# Patient Record
Sex: Female | Born: 1995 | Race: White | Hispanic: No | Marital: Married | Smoking: Never smoker
Health system: Southern US, Community
[De-identification: ages and names within clinical notes are randomized; demographics above are authoritative.]

---

## 2016-09-05 DIAGNOSIS — Z13220 Encounter for screening for lipoid disorders: Secondary | ICD-10-CM | POA: Diagnosis not present

## 2016-09-05 DIAGNOSIS — Z0000 Encounter for general adult medical examination without abnormal findings: Secondary | ICD-10-CM | POA: Diagnosis not present

## 2017-10-24 DIAGNOSIS — L700 Acne vulgaris: Secondary | ICD-10-CM | POA: Diagnosis not present

## 2017-10-24 DIAGNOSIS — L249 Irritant contact dermatitis, unspecified cause: Secondary | ICD-10-CM | POA: Diagnosis not present

## 2017-10-24 DIAGNOSIS — Z79899 Other long term (current) drug therapy: Secondary | ICD-10-CM | POA: Diagnosis not present

## 2017-11-21 DIAGNOSIS — Z79899 Other long term (current) drug therapy: Secondary | ICD-10-CM | POA: Diagnosis not present

## 2017-11-21 DIAGNOSIS — L700 Acne vulgaris: Secondary | ICD-10-CM | POA: Diagnosis not present

## 2017-12-18 DIAGNOSIS — R0600 Dyspnea, unspecified: Secondary | ICD-10-CM | POA: Diagnosis not present

## 2017-12-21 DIAGNOSIS — L700 Acne vulgaris: Secondary | ICD-10-CM | POA: Diagnosis not present

## 2017-12-21 DIAGNOSIS — Z79899 Other long term (current) drug therapy: Secondary | ICD-10-CM | POA: Diagnosis not present

## 2017-12-23 ENCOUNTER — Emergency Department (HOSPITAL_COMMUNITY)
Admission: EM | Admit: 2017-12-23 | Discharge: 2017-12-24 | Disposition: A | Discharge status: Home / Self Care | Payer: BLUE CROSS/BLUE SHIELD | Attending: Emergency Medicine | Admitting: Emergency Medicine

## 2017-12-23 ENCOUNTER — Emergency Department (HOSPITAL_COMMUNITY): Payer: BLUE CROSS/BLUE SHIELD

## 2017-12-23 ENCOUNTER — Encounter (HOSPITAL_COMMUNITY): Payer: Self-pay | Admitting: Emergency Medicine

## 2017-12-23 DIAGNOSIS — R42 Dizziness and giddiness: Secondary | ICD-10-CM | POA: Insufficient documentation

## 2017-12-23 DIAGNOSIS — R0789 Other chest pain: Secondary | ICD-10-CM | POA: Insufficient documentation

## 2017-12-23 DIAGNOSIS — R079 Chest pain, unspecified: Secondary | ICD-10-CM | POA: Diagnosis not present

## 2017-12-23 DIAGNOSIS — R0602 Shortness of breath: Secondary | ICD-10-CM | POA: Diagnosis not present

## 2017-12-23 DIAGNOSIS — R0600 Dyspnea, unspecified: Secondary | ICD-10-CM | POA: Diagnosis not present

## 2017-12-23 DIAGNOSIS — R51 Headache: Secondary | ICD-10-CM | POA: Diagnosis not present

## 2017-12-23 DIAGNOSIS — R55 Syncope and collapse: Secondary | ICD-10-CM | POA: Insufficient documentation

## 2017-12-23 LAB — CBC WITH DIFFERENTIAL/PLATELET
BASOS ABS: 0 10*3/uL (ref 0.0–0.1)
BASOS PCT: 1 %
EOS PCT: 0 %
Eosinophils Absolute: 0 10*3/uL (ref 0.0–0.7)
HEMATOCRIT: 42.8 % (ref 36.0–46.0)
Hemoglobin: 14.4 g/dL (ref 12.0–15.0)
LYMPHS PCT: 11 %
Lymphs Abs: 0.8 10*3/uL (ref 0.7–4.0)
MCH: 30.7 pg (ref 26.0–34.0)
MCHC: 33.6 g/dL (ref 30.0–36.0)
MCV: 91.3 fL (ref 78.0–100.0)
Monocytes Absolute: 0.8 10*3/uL (ref 0.1–1.0)
Monocytes Relative: 12 %
NEUTROS ABS: 5.1 10*3/uL (ref 1.7–7.7)
Neutrophils Relative %: 76 %
PLATELETS: 316 10*3/uL (ref 150–400)
RBC: 4.69 MIL/uL (ref 3.87–5.11)
RDW: 12.9 % (ref 11.5–15.5)
WBC: 6.8 10*3/uL (ref 4.0–10.5)

## 2017-12-23 LAB — BASIC METABOLIC PANEL
Anion gap: 12 (ref 5–15)
BUN: 13 mg/dL (ref 6–20)
CHLORIDE: 103 mmol/L (ref 101–111)
CO2: 20 mmol/L — ABNORMAL LOW (ref 22–32)
Calcium: 9.6 mg/dL (ref 8.9–10.3)
Creatinine, Ser: 0.79 mg/dL (ref 0.44–1.00)
GFR calc Af Amer: >60 mL/min (ref >60)
GFR calc non Af Amer: >60 mL/min (ref >60)
GLUCOSE: 101 mg/dL — AB (ref 65–99)
POTASSIUM: 3.9 mmol/L (ref 3.5–5.1)
Sodium: 135 mmol/L (ref 135–145)

## 2017-12-23 LAB — D-DIMER, QUANTITATIVE: D-Dimer, Quant: 0.75 ug/mL-FEU — ABNORMAL HIGH (ref 0.00–0.50)

## 2017-12-23 LAB — I-STAT BETA HCG BLOOD, ED (MC, WL, AP ONLY)

## 2017-12-23 LAB — I-STAT CG4 LACTIC ACID, ED: LACTIC ACID, VENOUS: 0.98 mmol/L (ref 0.5–1.9)

## 2017-12-23 LAB — I-STAT TROPONIN, ED: Troponin i, poc: 0 ng/mL (ref 0.00–0.08)

## 2017-12-23 MED ORDER — SODIUM CHLORIDE 0.9 % IV BOLUS (SEPSIS)
1000.0000 mL | Freq: Once | INTRAVENOUS | Status: AC
  Administered 2017-12-23: 1000 mL via INTRAVENOUS

## 2017-12-23 MED ORDER — IOPAMIDOL (ISOVUE-370) INJECTION 76%
INTRAVENOUS | Status: AC
  Administered 2017-12-24: 100 mL
  Filled 2017-12-23: qty 100

## 2017-12-23 NOTE — ED Triage Notes (Signed)
Patient reports SOB with mild chest tightness onset this afternoon , denies cough , no fever or chills . HR= 136/min at triage .

## 2017-12-23 NOTE — ED Notes (Addendum)
Patient had a syncopal episode while at triage room .

## 2017-12-23 NOTE — ED Provider Notes (Signed)
[facility] EMERGENCY DEPARTMENT Provider Note   CSN: [id number] Arrival date & time: 12/23/17  2227     History   Chief Complaint Chief Complaint  Patient presents with  . Shortness of Breath    Chest Tightness    HPI Susan Knox is a 22 y.o. female.  Patient presents with central chest tightness and shortness of breath at onset around 4 PM.  She states this happened while she was eating chocolate from [employer].  She felt well prior to this.  She immediately developed a headache after eating the chocolate followed by chest tightness and shortness of breath.  Denies any cough or fever.  Chest tightness is central and does not radiate.  No abdominal pain, nausea or vomiting.  States good p.o. intake and urine output today.  No runny nose, sore throat, fever, body aches.  Denies any chronic medical conditions or chronic medications.  While getting blood drawn in triage, she had a syncopal episode which was preceded by dizziness and lightheadedness.  No injury. Denies possibility of pregnancy.   The history is provided by a relative and the patient.  Shortness of Breath  Associated symptoms include headaches. Pertinent negatives include no fever, no rhinorrhea, no cough, no chest pain, no vomiting, no abdominal pain and no rash.    History reviewed. No pertinent past medical history.  There are no active problems to display for this patient.   History reviewed. No pertinent surgical history.  OB History    No data available       Home Medications    Prior to Admission medications   Not on File    Family History No family history on file.  Social History Social History   Tobacco Use  . Smoking status: Never Smoker  . Smokeless tobacco: Never Used  Substance Use Topics  . Alcohol use: No    Frequency: Never  . Drug use: No     Allergies   Patient has no known allergies.   Review of Systems Review of Systems  Constitutional:  Positive for fatigue. Negative for activity change, appetite change and fever.  HENT: Negative for congestion and rhinorrhea.   Respiratory: Positive for chest tightness and shortness of breath. Negative for cough.   Cardiovascular: Negative for chest pain.  Gastrointestinal: Negative for abdominal pain and vomiting.  Genitourinary: Negative for dysuria, hematuria, vaginal bleeding and vaginal discharge.  Musculoskeletal: Negative for arthralgias, back pain and myalgias.  Skin: Negative for rash.  Neurological: Positive for dizziness, syncope, light-headedness and headaches. Negative for weakness.    all other systems are negative except as noted in the HPI and PMH.    Physical Exam Updated Vital Signs BP (!) 66/50 (BP Location: Left Arm)   Pulse (!) 52   Temp 100 F (37.8 C) (Oral)   Resp 16   LMP 11/21/2017 (Approximate)   SpO2 99%   Physical Exam  Constitutional: She is oriented to person, place, and time. She appears well-developed and well-nourished. No distress.  HENT:  Head: Normocephalic and atraumatic.  Mouth/Throat: Oropharynx is clear and moist. No oropharyngeal exudate.  Moist mucous membranes, no tongue or lip swelling, uvula midline  Eyes: Conjunctivae and EOM are normal. Pupils are equal, round, and reactive to light.  Neck: Normal range of motion. Neck supple.  No meningismus.  Cardiovascular: Normal rate, normal heart sounds and intact distal pulses.  No murmur heard. Tachycardic 110s  Pulmonary/Chest: Effort normal and breath sounds normal. No respiratory  distress. She exhibits no tenderness.  Abdominal: Soft. There is no tenderness. There is no rebound and no guarding.  Musculoskeletal: Normal range of motion. She exhibits no edema or tenderness.  Neurological: She is alert and oriented to person, place, and time. No cranial nerve deficit. She exhibits normal muscle tone. Coordination normal.  No ataxia on finger to nose bilaterally. No pronator drift. 5/5  strength throughout. CN 2-12 intact.Equal grip strength. Sensation intact.   Skin: Skin is warm. Capillary refill takes less than 2 seconds. No erythema.  Psychiatric: She has a normal mood and affect. Her behavior is normal.  Nursing note and vitals reviewed.    ED Treatments / Results  Labs (all labs ordered are listed, but only abnormal results are displayed) Labs Reviewed  BASIC METABOLIC PANEL - Abnormal; Notable for the following components:      Result Value   CO2 20 (*)    Glucose, Bld 101 (*)    All other components within normal limits  D-DIMER, QUANTITATIVE (NOT AT [facility]) - Abnormal; Notable for the following components:   D-Dimer, Quant 0.75 (*)    All other components within normal limits  URINALYSIS, ROUTINE W REFLEX MICROSCOPIC - Abnormal; Notable for the following components:   Color, Urine STRAW (*)    Ketones, ur 5 (*)    All other components within normal limits  CBC WITH DIFFERENTIAL/PLATELET  RAPID URINE DRUG SCREEN, HOSP PERFORMED  TSH  I-STAT TROPONIN, ED  I-STAT BETA HCG BLOOD, ED ([facility], [facility], [facility] ONLY)  I-STAT CG4 LACTIC ACID, ED  CBG MONITORING, ED  I-STAT CG4 LACTIC ACID, ED  I-STAT TROPONIN, ED    EKG  EKG Interpretation  Date/Time:  Saturday December 23 2017 22:40:35 EST Ventricular Rate:  137 PR Interval:  116 QRS Duration: 76 QT Interval:  292 QTC Calculation: 440 R Axis:   105 Text Interpretation:  Sinus tachycardia Rightward axis Anterior infarct , age undetermined Abnormal ECG No previous ECGs available Confirmed by Glynn Octaveancour, Siddhant Hashemi [phone]([id number]) on 12/23/2017 10:55:35 PM       Radiology Dg Chest 2 View  Result Date: 12/23/2017 CLINICAL DATA:  Chest pain.  Shortness of breath. EXAM: CHEST  2 VIEW COMPARISON:  None. FINDINGS: The heart size and mediastinal contours are within normal limits. Both lungs are clear. The visualized skeletal structures are unremarkable. IMPRESSION: No active cardiopulmonary disease. Electronically Signed   By: Gerome Samavid   Williams III M.D   On: 12/23/2017 23:52   Ct Angio Chest Pe W And/or Wo Contrast  Result Date: 12/24/2017 CLINICAL DATA:  22 year old female with shortness of breath. EXAM: CT ANGIOGRAPHY CHEST WITH CONTRAST TECHNIQUE: Multidetector CT imaging of the chest was performed using the standard protocol during bolus administration of intravenous contrast. Multiplanar CT image reconstructions and MIPs were obtained to evaluate the vascular anatomy. CONTRAST:  100mL ISOVUE-370 IOPAMIDOL (ISOVUE-370) INJECTION 76% COMPARISON:  Chest radiograph dated 12/23/2017 FINDINGS: Cardiovascular: There is no cardiomegaly or pericardial effusion. The thoracic aorta is unremarkable. The origins of the great vessels of the aortic arch are patent. There is no CT evidence of pulmonary embolism. Mediastinum/Nodes: Top-normal right hilar lymph node measures 9 mm. No mediastinal adenopathy. Mildly prominent superior aortic pericardial recess in the anterior mediastinum. Lungs/Pleura: Mildly diffuse hazy airspace density with areas of heterogeneity at the lung bases most consistent with atelectatic changes. Mild edema or an atypical infection is less likely. Clinical correlation is recommended. There is no focal consolidation, pleural effusion, or pneumothorax. The central airways are patent. Upper Abdomen:  No acute abnormality. Musculoskeletal: No chest wall abnormality. No acute or significant osseous findings. Review of the MIP images confirms the above findings. IMPRESSION: 1. No CT evidence of pulmonary embolism. 2. Mild diffuse hazy airspace density, likely atelectatic changes. Edema or atypical infection is less likely. Clinical correlation is recommended. Electronically Signed   By: Elgie Collard M.D.   On: 12/24/2017 00:27    Procedures Procedures (including critical care time)  Medications Ordered in ED Medications  sodium chloride 0.9 % bolus 1,000 mL (1,000 mLs Intravenous New Bag/Given 12/23/17 2308)  sodium chloride  0.9 % bolus 1,000 mL (1,000 mLs Intravenous New Bag/Given 12/23/17 2309)     Initial Impression / Assessment and Plan / ED Course  I have reviewed the triage vital signs and the nursing notes.  Pertinent labs & imaging results that were available during my care of the patient were reviewed by me and considered in my medical decision making (see chart for details).    Patient with chest tightness, shortness of breath onset after eating chocolate this afternoon.  She feels improved now after syncopal episode in triage.  She is tachycardic with moist mucous membranes.  No chest pain or shortness of breath currently  Nonfocal neuro exam.  Patient given IV fluids.  EKG with sinus tachycardia.  HCG is negative  Troponin is negative.  Patient given IV fluids.  D-dimer is positive.  She denies any chest pain or shortness of breath currently.  There is no evidence of PE. No CP or SOB.  No throat tightness. No tongue or lip swelling. No rash.  Unclear whether this is allergic reaction. No rash or wheezing. No swelling or itching.  Heart rate has improved to 98.  Patient is asymptomatic.  She is tolerating p.o. and ambulatory.  No chest pain or shortness of breath.  Believe anxiety is playing a role in her tachycardia. TSH wnl.  Low suspicion for ACS or PE. Followup with PCP regarding tachycardia. Avoid this brand of chocolate in future. Return precautions discussed.  BP (!) 116/59   Pulse (!) 107   Temp 98.6 F (37 C) (Oral)   Resp 19   LMP 11/21/2017 (Approximate)   SpO2 96%   Final Clinical Impressions(s) / ED Diagnoses   Final diagnoses:  Atypical chest pain  Dyspnea, unspecified type    ED Discharge Orders    None       Britanni Yarde, Jeannett Senior, MD 12/24/17 0900

## 2017-12-24 ENCOUNTER — Emergency Department (HOSPITAL_COMMUNITY): Payer: BLUE CROSS/BLUE SHIELD

## 2017-12-24 DIAGNOSIS — R0602 Shortness of breath: Secondary | ICD-10-CM | POA: Diagnosis not present

## 2017-12-24 LAB — URINALYSIS, ROUTINE W REFLEX MICROSCOPIC
BILIRUBIN URINE: NEGATIVE
GLUCOSE, UA: NEGATIVE mg/dL
Hgb urine dipstick: NEGATIVE
KETONES UR: 5 mg/dL — AB
Leukocytes, UA: NEGATIVE
NITRITE: NEGATIVE
PH: 7 (ref 5.0–8.0)
Protein, ur: NEGATIVE mg/dL
Specific Gravity, Urine: 1.016 (ref 1.005–1.030)

## 2017-12-24 LAB — RAPID URINE DRUG SCREEN, HOSP PERFORMED
Amphetamines: NOT DETECTED
BARBITURATES: NOT DETECTED
Benzodiazepines: NOT DETECTED
Cocaine: NOT DETECTED
Opiates: NOT DETECTED
Tetrahydrocannabinol: NOT DETECTED

## 2017-12-24 LAB — CBG MONITORING, ED: Glucose-Capillary: 85 mg/dL (ref 65–99)

## 2017-12-24 LAB — I-STAT CG4 LACTIC ACID, ED: LACTIC ACID, VENOUS: 0.8 mmol/L (ref 0.5–1.9)

## 2017-12-24 LAB — TSH: TSH: 1.507 u[IU]/mL (ref 0.350–4.500)

## 2017-12-24 LAB — I-STAT TROPONIN, ED: Troponin i, poc: 0.01 ng/mL (ref 0.00–0.08)

## 2017-12-24 MED ORDER — SODIUM CHLORIDE 0.9 % IV BOLUS (SEPSIS)
1000.0000 mL | Freq: Once | INTRAVENOUS | Status: AC
  Administered 2017-12-24: 1000 mL via INTRAVENOUS

## 2017-12-24 NOTE — Discharge Instructions (Signed)
There is no evidence of heart attack or blood clot in the lung.  It is unclear whether you had an allergic reaction or adverse reaction to the chocolate that you ate. Use benadryl as needed for rash or itching. Keep yourself hydrated.  And follow-up with your doctor.  Return to the ED if you develop chest pain, shortness of breath, or any new or worsening symptoms.

## 2017-12-24 NOTE — ED Notes (Signed)
Pt to br 

## 2017-12-25 DIAGNOSIS — E7800 Pure hypercholesterolemia, unspecified: Secondary | ICD-10-CM | POA: Diagnosis not present

## 2017-12-25 DIAGNOSIS — R000 Tachycardia, unspecified: Secondary | ICD-10-CM | POA: Diagnosis not present

## 2017-12-25 DIAGNOSIS — R0602 Shortness of breath: Secondary | ICD-10-CM | POA: Diagnosis not present

## 2017-12-26 DIAGNOSIS — Z79899 Other long term (current) drug therapy: Secondary | ICD-10-CM | POA: Diagnosis not present

## 2017-12-26 DIAGNOSIS — L700 Acne vulgaris: Secondary | ICD-10-CM | POA: Diagnosis not present

## 2018-01-17 DIAGNOSIS — R0600 Dyspnea, unspecified: Secondary | ICD-10-CM | POA: Diagnosis not present

## 2018-01-29 DIAGNOSIS — Z79899 Other long term (current) drug therapy: Secondary | ICD-10-CM | POA: Diagnosis not present

## 2018-01-29 DIAGNOSIS — L700 Acne vulgaris: Secondary | ICD-10-CM | POA: Diagnosis not present

## 2018-01-30 DIAGNOSIS — Z79899 Other long term (current) drug therapy: Secondary | ICD-10-CM | POA: Diagnosis not present

## 2018-01-30 DIAGNOSIS — L700 Acne vulgaris: Secondary | ICD-10-CM | POA: Diagnosis not present

## 2018-02-15 DIAGNOSIS — R05 Cough: Secondary | ICD-10-CM | POA: Diagnosis not present

## 2018-02-15 DIAGNOSIS — R0600 Dyspnea, unspecified: Secondary | ICD-10-CM | POA: Diagnosis not present

## 2018-03-02 DIAGNOSIS — L900 Lichen sclerosus et atrophicus: Secondary | ICD-10-CM | POA: Diagnosis not present

## 2018-03-02 DIAGNOSIS — Z79899 Other long term (current) drug therapy: Secondary | ICD-10-CM | POA: Diagnosis not present

## 2018-03-05 DIAGNOSIS — Z79899 Other long term (current) drug therapy: Secondary | ICD-10-CM | POA: Diagnosis not present

## 2018-03-05 DIAGNOSIS — L700 Acne vulgaris: Secondary | ICD-10-CM | POA: Diagnosis not present

## 2018-04-02 DIAGNOSIS — L700 Acne vulgaris: Secondary | ICD-10-CM | POA: Diagnosis not present

## 2018-04-02 DIAGNOSIS — Z79899 Other long term (current) drug therapy: Secondary | ICD-10-CM | POA: Diagnosis not present

## 2018-04-03 DIAGNOSIS — L700 Acne vulgaris: Secondary | ICD-10-CM | POA: Diagnosis not present

## 2018-04-03 DIAGNOSIS — Z79899 Other long term (current) drug therapy: Secondary | ICD-10-CM | POA: Diagnosis not present

## 2018-05-07 DIAGNOSIS — L700 Acne vulgaris: Secondary | ICD-10-CM | POA: Diagnosis not present

## 2018-05-07 DIAGNOSIS — Z79899 Other long term (current) drug therapy: Secondary | ICD-10-CM | POA: Diagnosis not present

## 2018-05-17 DIAGNOSIS — R0600 Dyspnea, unspecified: Secondary | ICD-10-CM | POA: Diagnosis not present

## 2018-05-23 DIAGNOSIS — R918 Other nonspecific abnormal finding of lung field: Secondary | ICD-10-CM | POA: Diagnosis not present

## 2018-06-04 DIAGNOSIS — L700 Acne vulgaris: Secondary | ICD-10-CM | POA: Diagnosis not present

## 2018-06-04 DIAGNOSIS — Z79899 Other long term (current) drug therapy: Secondary | ICD-10-CM | POA: Diagnosis not present

## 2018-06-05 DIAGNOSIS — L700 Acne vulgaris: Secondary | ICD-10-CM | POA: Diagnosis not present

## 2018-06-05 DIAGNOSIS — Z79899 Other long term (current) drug therapy: Secondary | ICD-10-CM | POA: Diagnosis not present

## 2018-06-22 DIAGNOSIS — R0600 Dyspnea, unspecified: Secondary | ICD-10-CM | POA: Diagnosis not present

## 2018-06-22 DIAGNOSIS — R0602 Shortness of breath: Secondary | ICD-10-CM | POA: Diagnosis not present

## 2018-07-06 DIAGNOSIS — L700 Acne vulgaris: Secondary | ICD-10-CM | POA: Diagnosis not present

## 2018-07-06 DIAGNOSIS — Z79899 Other long term (current) drug therapy: Secondary | ICD-10-CM | POA: Diagnosis not present

## 2018-07-09 DIAGNOSIS — L700 Acne vulgaris: Secondary | ICD-10-CM | POA: Diagnosis not present

## 2018-08-07 DIAGNOSIS — L700 Acne vulgaris: Secondary | ICD-10-CM | POA: Diagnosis not present

## 2018-08-07 DIAGNOSIS — Z79899 Other long term (current) drug therapy: Secondary | ICD-10-CM | POA: Diagnosis not present

## 2018-08-14 DIAGNOSIS — J351 Hypertrophy of tonsils: Secondary | ICD-10-CM | POA: Diagnosis not present

## 2018-08-14 DIAGNOSIS — R000 Tachycardia, unspecified: Secondary | ICD-10-CM | POA: Diagnosis not present

## 2018-09-03 DIAGNOSIS — J351 Hypertrophy of tonsils: Secondary | ICD-10-CM | POA: Diagnosis not present

## 2018-09-03 DIAGNOSIS — Q181 Preauricular sinus and cyst: Secondary | ICD-10-CM | POA: Diagnosis not present

## 2018-09-03 DIAGNOSIS — J342 Deviated nasal septum: Secondary | ICD-10-CM | POA: Diagnosis not present

## 2018-09-11 DIAGNOSIS — L708 Other acne: Secondary | ICD-10-CM | POA: Diagnosis not present

## 2018-10-02 NOTE — Progress Notes (Signed)
Cardiology Office Note:    Date:  10/03/2018   ID:  Susan Knox, DOB 07-07-1996, MRN [id number]  PCP:  Deatra James, MD  Cardiologist:  No primary care provider on file.   Referring MD: Deatra James, MD   Chief Complaint  Patient presents with  . Advice Only    Hyperlipidemia  . Tachycardia    History of Present Illness:    Susan Knox is a 22 y.o. female with a hx of palpitations referred for consultation by Dr.V. Sun due to tachycardia  Susan Knox is a very pleasant 22 year old female who began noticing increased heart rates on her wearable iPhone over the past year.  She is referred because there are times where without physical activity, she develops heart rates as high as 170 bpm.  She is usually able to document the rapid heart rates with her iPhone.  She brings a log of her daily heart rate ranges over the past month and frequently has heart rates greater than 150 bpm, occurring without exercise or heavy physical effort.  She can feel her heart racing.  Episodes tend to be abrupt in onset and offset.  She denies chest pain, syncope, dyspnea, and feels that she has no exertional limitations.  She is not one who exercises on a regular basis but does not feel that she is physically deconditioned.  She is aware of extremely elevated lipids.  Reviewing medical records within the [facility] system, she did have an emergency room visit in January 2019 because of atypical chest pain and dyspnea.  A CT scan at that time revealed groundglass interstitial pattern likely suggesting infection.  A repeat CT scan performed at [facility] revealed improvement 6 months later.  History reviewed. No pertinent past medical history.  History reviewed. No pertinent surgical history.  Current Medications: No outpatient medications have been marked as taking for the 10/03/18 encounter (Office Visit) with Lyn Records, MD.     Allergies:   Patient has no known allergies.   Social History    Socioeconomic History  . Marital status: Single    Spouse name: Not on file  . Number of children: Not on file  . Years of education: Not on file  . Highest education level: Not on file  Occupational History  . Not on file  Social Needs  . Financial resource strain: Not on file  . Food insecurity:    Worry: Not on file    Inability: Not on file  . Transportation needs:    Medical: Not on file    Non-medical: Not on file  Tobacco Use  . Smoking status: Never Smoker  . Smokeless tobacco: Never Used  Substance and Sexual Activity  . Alcohol use: No    Frequency: Never  . Drug use: No  . Sexual activity: Not on file  Lifestyle  . Physical activity:    Days per week: Not on file    Minutes per session: Not on file  . Stress: Not on file  Relationships  . Social connections:    Talks on phone: Not on file    Gets together: Not on file    Attends religious service: Not on file    Active member of club or organization: Not on file    Attends meetings of clubs or organizations: Not on file    Relationship status: Not on file  Other Topics Concern  . Not on file  Social History Narrative  . Not on file  Family History: The patient's family history includes Atrial fibrillation in her father; Healthy in her brother and mother.  ROS:   Please see the history of present illness.    Other than palpitations, she has no complaints.  All other systems reviewed and are negative.  EKGs/Labs/Other Studies Reviewed:    The following studies were reviewed today:   High-resolution chest CT June 2019  Exam date/time: 05/23/2018 12:34 PM   FINDINGS:   SUPPORT APPARATUS: N.A.   LUNGS/PLEURA: Improved groundglass opacities compared to prior exam from 12/24/2017. There are a few residual patchy areas of groundglass opacity in the superior segment of the right lower lobe and very subtle areas in the medial right lower lobe. Increased atelectasis  in the lower lobes with  aspiration. No pneumothorax.  No abnormal pulmonary masses.  No pleural effusions.  HEART/MEDIASTINUM: Cardiac size is normal. No acute thoracic aortic abnormalities.  No hilar, mediastinal, or axillary lymphadenopathy.  MUSCULOSKELETAL: No acute or destructive osseous processes.  CHRONIC/INCIDENTAL FINDINGS: N.A.  Other Result Information  Acute Interface, Incoming Rad Results - 05/25/2018  8:21 AM EDT COMPARISON: Outside CT 12/24/2017 INDICATION: abnormal lung sounds, prior ground glass infiltrates TECHNIQUE:  CT CHEST HIGH RESOLUTION -  Contrast: No administration of oral or intravenous contrast.   Radiation dose reduction was utilized (automated exposure control, mA or kV adjustment based on patient size, or iterative image reconstruction).  Exam date/time: 05/23/2018 12:34 PM   FINDINGS:   SUPPORT APPARATUS: N.A.   LUNGS/PLEURA: Improved groundglass opacities compared to prior exam from 12/24/2017. There are a few residual patchy areas of groundglass opacity in the superior segment of the right lower lobe and very subtle areas in the medial right lower lobe. Increased atelectasis  in the lower lobes with aspiration. No pneumothorax.  No abnormal pulmonary masses.  No pleural effusions.  HEART/MEDIASTINUM: Cardiac size is normal. No acute thoracic aortic abnormalities.  No hilar, mediastinal, or axillary lymphadenopathy.  MUSCULOSKELETAL: No acute or destructive osseous processes.  CHRONIC/INCIDENTAL FINDINGS: N.A.   IMPRESSION: Significantly improved groundglass opacities compared to CT from 12/24/2017. There are a few residual groundglass opacities, exacerbated on expiration. Findings suggest atelectasis/air trapping.     Chest CT [facility] done at [facility] 01/01/2018: IMPRESSION: 1. No CT evidence of pulmonary embolism. 2. Mild diffuse hazy airspace density, likely atelectatic changes. Edema or atypical infection is less likely. Clinical correlation  is recommended.  LABORATORY DATA 2019:  LDL cholesterol 273 milligrams per deciliter in 2017  Total cholesterol 280 05 January 2018  TSH 1.507 January 2019  EKG:  EKG is  ordered today.  The ekg ordered today demonstrates normal sinus rhythm, right axis deviation, and poor R wave progression.  When compared to a tracing from January 2019 no significant change has occurred other than heart rate is now normal compared to 137 bpm at that time.  Recent Labs: 12/23/2017: BUN 13; Creatinine, Ser 0.79; Hemoglobin 14.4; Platelets 316; Potassium 3.9; Sodium 135 12/24/2017: TSH 1.507  Recent Lipid Panel No results found for: CHOL, TRIG, HDL, CHOLHDL, VLDL, LDLCALC, LDLDIRECT  Physical Exam:    VS:  BP 114/82   Pulse 92   Ht 5\' 2"  (1.575 m)   Wt 149 lb 6.4 oz (67.8 kg)   BMI 27.33 kg/m     Wt Readings from Last 3 Encounters:  10/03/18 149 lb 6.4 oz (67.8 kg)     GEN:  Well nourished, well developed in no acute distress.  She is short. HEENT: Normal NECK: No  JVD.  Thick neck but I would not describe her as having a web neck...  LYMPHATICS: No lymphadenopathy CARDIAC: RRR, no murmur, no gallop, no edema. VASCULAR: 2+ carotid, radial, and 1+ popliteal bilateral pulses.  No bruits. RESPIRATORY:  Clear to auscultation without rales, wheezing or rhonchi  ABDOMEN: Soft, non-tender, non-distended, No pulsatile mass, MUSCULOSKELETAL: No deformity  SKIN: Warm and dry NEUROLOGIC:  Alert and oriented x 3 PSYCHIATRIC:  Normal affect   ASSESSMENT:    1. Atrial tachycardia (HCC)   2. Right axis deviation   3. Familial hypercholesterolemia   4. Heart palpitations    PLAN:    In order of problems listed above:  1. 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  Her heart rate ranges into the 150-170 range without physical activity, occurring suddenly and resolving quickly.  30-day event monitor is ordered. 2. Given her body habitus, there is a  possibility of congenital heart disease, possibly ASD.  With right axis noted on EKG, we should consider performing an echocardiogram. 3. Lipids are severely elevated with LDL greater than 180.  The most recent LDL was 273 mg/dL in 6045.  Total cholesterol at that time was noted to be 280 in February 2019.  Given her age, she is most likely having a reentrant tachycardia.  We need to identify by continuous monitoring.  Right axis deviation on EKG raises the question of underlying congenital heart disease such as ASD.  No clinical evidence of RVH.  No symptoms.  But will likely need to have an echocardiogram to fully assess.  The most significant problem in my opinion, is the severe elevation in lipids.  I have reviewed her CT scans and there is no evidence of coronary plaque.  She will likely need to be on a program to reduce LDL cholesterol.  Likely refer her to a lipid neurologist, Dr. [place] Hilty within our practice or other depending upon her preference.  For the time being we will simply monitor and determine if she is having arrhythmia with further work-up depending upon findings of monitor.   Medication Adjustments/Labs and Tests Ordered: Current medicines are reviewed at length with the patient today.  Concerns regarding medicines are outlined above.  Orders Placed This Encounter  Procedures  . CARDIAC EVENT MONITOR  . EKG 12-Lead   No orders of the defined types were placed in this encounter.   Patient Instructions  Medication Instructions:  Your physician recommends that you continue on your current medications as directed. Please refer to the Current Medication list given to you today.  If you need a refill on your cardiac medications before your next appointment, please call your pharmacy.   Lab work: none If you have labs (blood work) drawn today and your tests are completely normal, you will receive your results only by: Marland Kitchen MyChart Message (if you have MyChart) OR . A paper  copy in the mail If you have any lab test that is abnormal or we need to change your treatment, we will call you to review the results.  Testing/Procedures: Your physician has recommended that you wear an event monitor. Event monitors are medical devices that record the heart's electrical activity. Doctors most often [place] these monitors to diagnose arrhythmias. Arrhythmias are problems with the speed or rhythm of the heartbeat. The monitor is a small, portable device. You can wear one while you do your normal daily activities. This is usually used to diagnose what is causing  palpitations/syncope (passing out).    Follow-Up: Your physician recommends that you schedule a follow-up appointment in: 6-8 weeks. Scheduled for January 3,2020 at 1:40       Signed, Lesleigh Noe, MD  10/03/2018 10:52 AM    [facility]

## 2018-10-03 ENCOUNTER — Encounter: Payer: Self-pay | Admitting: Interventional Cardiology

## 2018-10-03 ENCOUNTER — Ambulatory Visit (INDEPENDENT_AMBULATORY_CARE_PROVIDER_SITE_OTHER): Payer: BLUE CROSS/BLUE SHIELD | Admitting: Interventional Cardiology

## 2018-10-03 VITALS — BP 114/82 | HR 92 | Ht 62.0 in | Wt 149.4 lb

## 2018-10-03 DIAGNOSIS — E7801 Familial hypercholesterolemia: Secondary | ICD-10-CM | POA: Diagnosis not present

## 2018-10-03 DIAGNOSIS — R002 Palpitations: Secondary | ICD-10-CM | POA: Diagnosis not present

## 2018-10-03 DIAGNOSIS — R9431 Abnormal electrocardiogram [ECG] [EKG]: Secondary | ICD-10-CM | POA: Diagnosis not present

## 2018-10-03 DIAGNOSIS — I471 Supraventricular tachycardia: Secondary | ICD-10-CM | POA: Diagnosis not present

## 2018-10-03 NOTE — Patient Instructions (Addendum)
Medication Instructions:  Your physician recommends that you continue on your current medications as directed. Please refer to the Current Medication list given to you today.  If you need a refill on your cardiac medications before your next appointment, please call your pharmacy.   Lab work: none If you have labs (blood work) drawn today and your tests are completely normal, you will receive your results only by: Marland Kitchen MyChart Message (if you have MyChart) OR . A paper copy in the mail If you have any lab test that is abnormal or we need to change your treatment, we will call you to review the results.  Testing/Procedures: Your physician has recommended that you wear an event monitor. Event monitors are medical devices that record the heart's electrical activity. Doctors most often [place] these monitors to diagnose arrhythmias. Arrhythmias are problems with the speed or rhythm of the heartbeat. The monitor is a small, portable device. You can wear one while you do your normal daily activities. This is usually used to diagnose what is causing palpitations/syncope (passing out).    Follow-Up: Your physician recommends that you schedule a follow-up appointment in: 6-8 weeks. Scheduled for January 3,2020 at 1:40

## 2018-10-11 ENCOUNTER — Ambulatory Visit (INDEPENDENT_AMBULATORY_CARE_PROVIDER_SITE_OTHER): Payer: BLUE CROSS/BLUE SHIELD

## 2018-10-11 DIAGNOSIS — R002 Palpitations: Secondary | ICD-10-CM | POA: Diagnosis not present

## 2018-10-15 DIAGNOSIS — L700 Acne vulgaris: Secondary | ICD-10-CM | POA: Diagnosis not present

## 2018-11-26 ENCOUNTER — Encounter: Payer: Self-pay | Admitting: *Deleted

## 2018-12-03 ENCOUNTER — Telehealth: Payer: Self-pay | Admitting: Interventional Cardiology

## 2018-12-03 MED ORDER — METOPROLOL SUCCINATE ER 25 MG PO TB24: 12.5000 mg | ORAL_TABLET | Freq: Every day | ORAL | 3 refills | Status: DC

## 2018-12-03 NOTE — Telephone Encounter (Signed)
New Message ° ° ° ° ° ° ° ° ° °Patient returned your call °

## 2018-12-03 NOTE — Telephone Encounter (Signed)
Spoke with pt and went over monitor results.  Pt verbalized understanding and was in agreement with this plan.

## 2018-12-06 NOTE — Progress Notes (Signed)
Cardiology Office Note:    Date:  12/07/2018   ID:  Susan Knox, DOB August 15, 1996, MRN [id number]  PCP:  Deatra James, MD  Cardiologist:  Lesleigh Noe, MD   Referring MD: Deatra James, MD   Chief Complaint  Patient presents with  . Follow-up    PSVT    History of Present Illness:    Susan Knox is a 23 y.o. female with a hx of a hx of palpitations referred for consultation by Dr.V. Sun due to tachycardia.  30-day monitor brief episodes of heart rate greater than 190 bpm consistent with paroxysmal atrial tachycardia.  Toprol-XL was recommended at 12.5 mg/day.  The patient balked at the recommendation and does not want to be on medication at her young age.  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  She really does not feel she is having that much difficulty at the current time and will prefer a more conservative approach.  She is physically active.  She is having no limitations in activity.  She exercises including an aerobics boxing class.  We reviewed the rhythm strips.  She has frequent rhythm strips with sinus tachycardia above 160 bpm.  History reviewed. No pertinent past medical history.  History reviewed. No pertinent surgical history.  Current Medications: No outpatient medications have been marked as taking for the 12/07/18 encounter (Office Visit) with Lyn Records, MD.     Allergies:   Patient has no known allergies.   Social History   Socioeconomic History  . Marital status: Single    Spouse name: Not on file  . Number of children: Not on file  . Years of education: Not on file  . Highest education level: Not on file  Occupational History  . Not on file  Social Needs  . Financial resource strain: Not on file  . Food insecurity:    Worry: Not on file    Inability: Not on file  . Transportation needs:    Medical: Not on file    Non-medical: Not on file  Tobacco Use  . Smoking status: Never Smoker  . Smokeless tobacco: Never Used    Substance and Sexual Activity  . Alcohol use: No    Frequency: Never  . Drug use: No  . Sexual activity: Not on file  Lifestyle  . Physical activity:    Days per week: Not on file    Minutes per session: Not on file  . Stress: Not on file  Relationships  . Social connections:    Talks on phone: Not on file    Gets together: Not on file    Attends religious service: Not on file    Active member of club or organization: Not on file    Attends meetings of clubs or organizations: Not on file    Relationship status: Not on file  Other Topics Concern  . Not on file  Social History Narrative  . Not on file     Family History: The patient's family history includes Atrial fibrillation in her father; Healthy in her brother and mother.  ROS:   Please see the history of present illness.    None All other systems reviewed and are negative.  EKGs/Labs/Other Studies Reviewed:    The following studies were reviewed today: October 11, 2018 continuous monitor: Study Highlights     Normal sinus rhythm and sinus tachycardia  Symptom of heart racing associated with sinus tachycardia  Sinus tachycardia led to several auto trigger  recordings. Fastest heart rate 197 bpm.  PACs and PVCs accounting for less than 1% of all cardiac activity respectively.  Heart rates in excess of 180 bpm may represent paroxysmal atrial tachycardia (reentrant atrial tachycardia)   High HR: 197 bpm at 10/31/2018 18:17 EST Low HR: 51 bpm Avg HR:  91 bpm     EKG:  EKG is not repeated today but the October 03, 2018 tracing revealed a PR interval of 125 ms, rightward axis, and otherwise normal in appearance.  Recent Labs: 12/23/2017: BUN 13; Creatinine, Ser 0.79; Hemoglobin 14.4; Platelets 316; Potassium 3.9; Sodium 135 12/24/2017: TSH 1.507  Recent Lipid Panel No results found for: CHOL, TRIG, HDL, CHOLHDL, VLDL, LDLCALC, LDLDIRECT  Physical Exam:    VS:  BP 120/76   Pulse (!) 102   Ht 5\' 2"  (1.575  m)   Wt 150 lb (68 kg)   SpO2 95%   BMI 27.44 kg/m     Wt Readings from Last 3 Encounters:  12/07/18 150 lb (68 kg)  10/03/18 149 lb 6.4 oz (67.8 kg)     GEN: Young and healthy-appearing. No acute distress HEENT: Normal NECK: No JVD. LYMPHATICS: No lymphadenopathy CARDIAC: RRR.  No murmur, gallop, edema VASCULAR: Pulses 2+ bilateral radial and carotid, Bruits are absent and carotids RESPIRATORY:  Clear to auscultation without rales, wheezing or rhonchi  ABDOMEN: Soft, non-tender, non-distended, No pulsatile mass, MUSCULOSKELETAL: No deformity  SKIN: Warm and dry NEUROLOGIC:  Alert and oriented x 3 PSYCHIATRIC:  Normal affect   ASSESSMENT:    1. PSVT (paroxysmal supraventricular tachycardia) (HCC)   2. Familial hypercholesterolemia    PLAN:    In order of problems listed above:  1. 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  After speaking with the patient she is having very minimal if any symptoms currently and therefore we have chosen against medical therapy.  Because of the slight right axis on baseline EKG, an echocardiogram is ordered to exclude the possibility of ASD or other silent congenital abnormality on exam. 2. History of familial hypercholesterolemia.  She has a cholesterol of 280 with an LDL greater than 200 and a triglyceride of 178.  This will need attention in the near future, probably with statin therapy.  PRN follow-up.  Monitoring for symptoms at this time   Medication Adjustments/Labs and Tests Ordered: Current medicines are reviewed at length with the patient today.  Concerns regarding medicines are outlined above.  Orders Placed This Encounter  Procedures  . ECHOCARDIOGRAM COMPLETE   No orders of the defined types were placed in this encounter.   Patient Instructions  Medication Instructions:  Your physician recommends that you continue on your current medications as directed. Please refer to the Current Medication list given to you  today.  If you need a refill on your cardiac medications before your next appointment, please call your pharmacy.   Lab work: None If you have labs (blood work) drawn today and your tests are completely normal, you will receive your results only by: Marland Kitchen. MyChart Message (if you have MyChart) OR . A paper copy in the mail If you have any lab test that is abnormal or we need to change your treatment, we will call you to review the results.  Testing/Procedures: Your physician has requested that you have an echocardiogram. Echocardiography is a painless test that uses sound waves to create images of your heart. It provides your doctor with information about the size and shape of your heart and how well your heart's chambers  and valves are working. This procedure takes approximately one hour. There are no restrictions for this procedure.   Follow-Up: Your physician recommends that you schedule a follow-up appointment as needed with Dr. Katrinka Blazing  Any Other Special Instructions Will Be Listed Below (If Applicable).       Signed, Lesleigh Noe, MD  12/07/2018 2:54 PM    [facility]

## 2018-12-07 ENCOUNTER — Ambulatory Visit (INDEPENDENT_AMBULATORY_CARE_PROVIDER_SITE_OTHER): Payer: BLUE CROSS/BLUE SHIELD | Admitting: Interventional Cardiology

## 2018-12-07 ENCOUNTER — Encounter: Payer: Self-pay | Admitting: Interventional Cardiology

## 2018-12-07 VITALS — BP 120/76 | HR 102 | Ht 62.0 in | Wt 150.0 lb

## 2018-12-07 DIAGNOSIS — I471 Supraventricular tachycardia, unspecified: Secondary | ICD-10-CM | POA: Insufficient documentation

## 2018-12-07 DIAGNOSIS — E7801 Familial hypercholesterolemia: Secondary | ICD-10-CM

## 2018-12-07 NOTE — Patient Instructions (Signed)
Medication Instructions:  Your physician recommends that you continue on your current medications as directed. Please refer to the Current Medication list given to you today.  If you need a refill on your cardiac medications before your next appointment, please call your pharmacy.   Lab work: None If you have labs (blood work) drawn today and your tests are completely normal, you will receive your results only by: . MyChart Message (if you have MyChart) OR . A paper copy in the mail If you have any lab test that is abnormal or we need to change your treatment, we will call you to review the results.  Testing/Procedures: Your physician has requested that you have an echocardiogram. Echocardiography is a painless test that uses sound waves to create images of your heart. It provides your doctor with information about the size and shape of your heart and how well your heart's chambers and valves are working. This procedure takes approximately one hour. There are no restrictions for this procedure.    Follow-Up: Your physician recommends that you schedule a follow-up appointment as needed with Dr. Smith.    Any Other Special Instructions Will Be Listed Below (If Applicable).    

## 2018-12-10 ENCOUNTER — Other Ambulatory Visit (HOSPITAL_COMMUNITY): Payer: BLUE CROSS/BLUE SHIELD

## 2018-12-27 ENCOUNTER — Other Ambulatory Visit (HOSPITAL_COMMUNITY): Payer: BLUE CROSS/BLUE SHIELD

## 2019-01-08 ENCOUNTER — Ambulatory Visit (HOSPITAL_COMMUNITY): Payer: BLUE CROSS/BLUE SHIELD | Attending: Cardiovascular Disease

## 2019-01-08 DIAGNOSIS — I471 Supraventricular tachycardia: Secondary | ICD-10-CM | POA: Diagnosis not present

## 2019-01-18 ENCOUNTER — Encounter: Payer: Self-pay | Admitting: *Deleted

## 2019-01-24 ENCOUNTER — Telehealth: Payer: Self-pay | Admitting: Interventional Cardiology

## 2019-01-24 NOTE — Telephone Encounter (Signed)
Follow up:    Patient calling concerning her results. Patient states that some one called her. Please call patient.

## 2019-01-24 NOTE — Telephone Encounter (Signed)
Informed pt of results. Pt verbalized understanding. 

## 2019-07-17 IMAGING — CT CT ANGIO CHEST
2 of 8 series · 19 of 46 positions shown · IV contrast (OMNI)
Comparison: Chest radiograph dated 12/23/2017

CLINICAL DATA: 21-year-old female with shortness of breath.

EXAM:
CT ANGIOGRAPHY CHEST WITH CONTRAST
TECHNIQUE: Multidetector CT imaging of the chest was performed using the
standard protocol during bolus administration of intravenous
contrast. Multiplanar CT image reconstructions and MIPs were
obtained to evaluate the vascular anatomy.
CONTRAST:  100mL AIGJ2J-FGJ IOPAMIDOL (AIGJ2J-FGJ) INJECTION 76%

[Series 6: thins · axial · 0.77mm/px · z∈[+870,+1122]mm · 16 of 279 slices shown]
[im 13/279  lung]
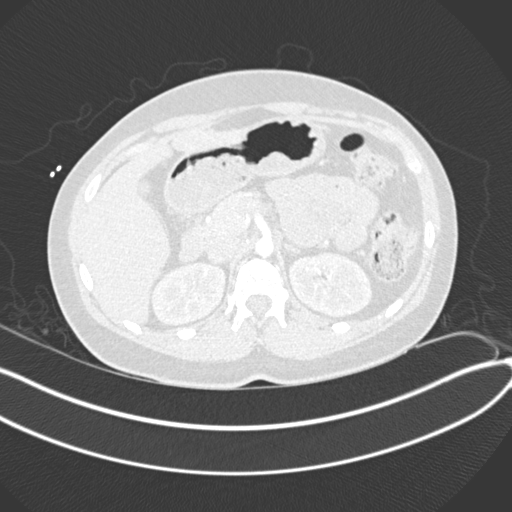
[im 26/279  soft-tissue]
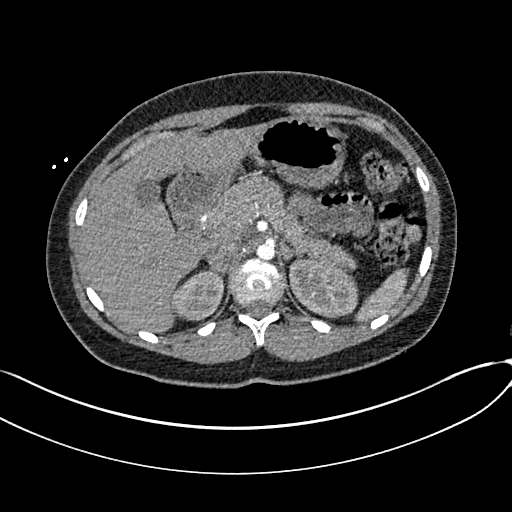
[im 51/279  lung]
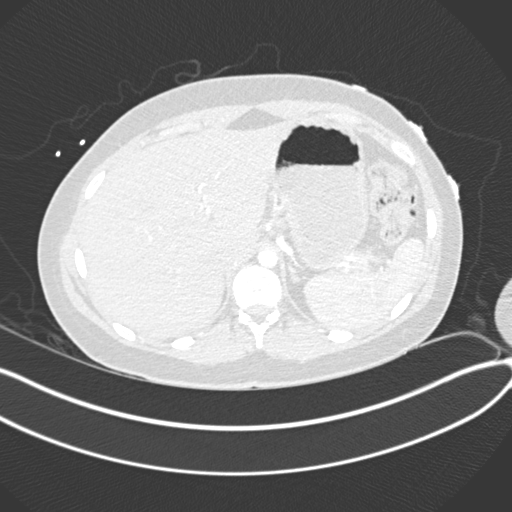
[im 64/279  soft-tissue]
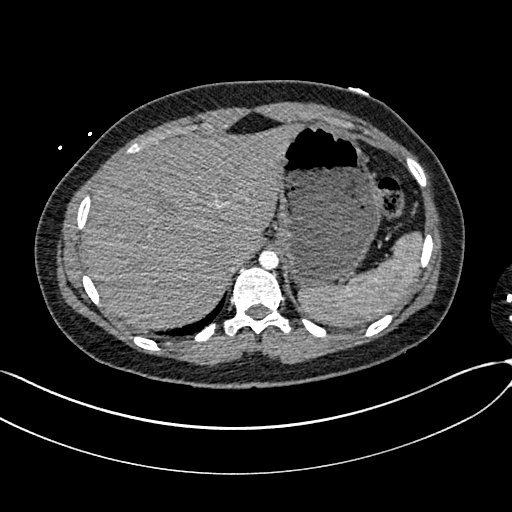
[im 76/279  lung]
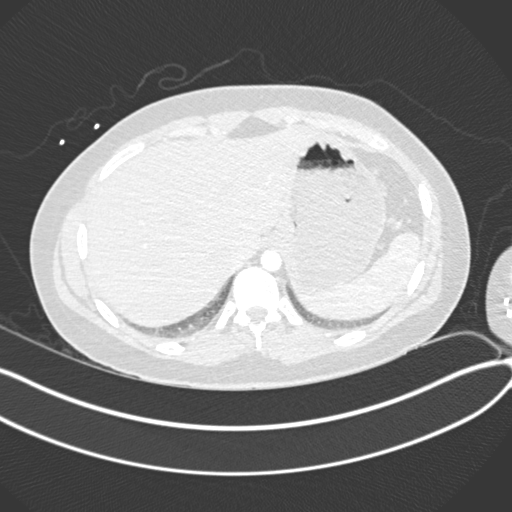
[im 102/279  soft-tissue]
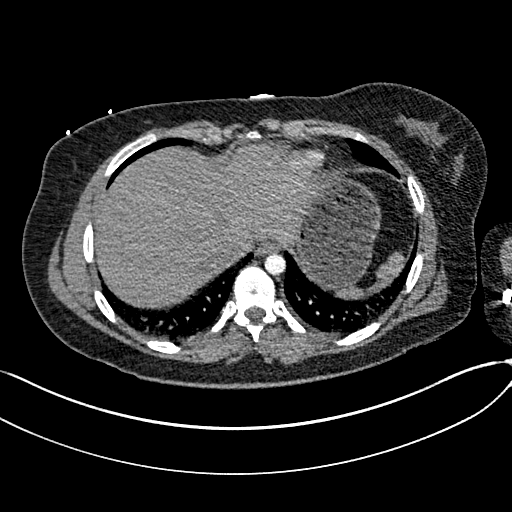
[im 114/279  lung]
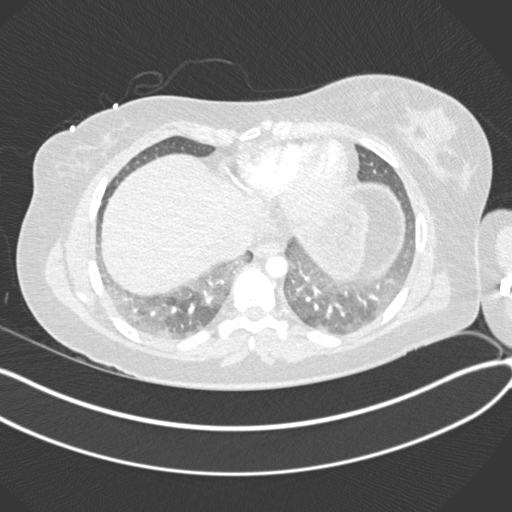
[im 127/279  soft-tissue]
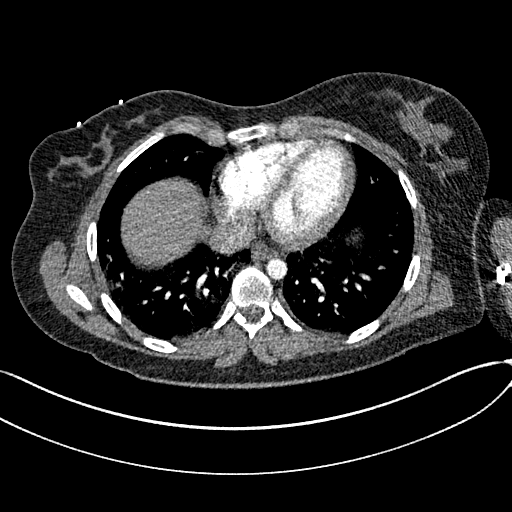
[im 152/279  lung]
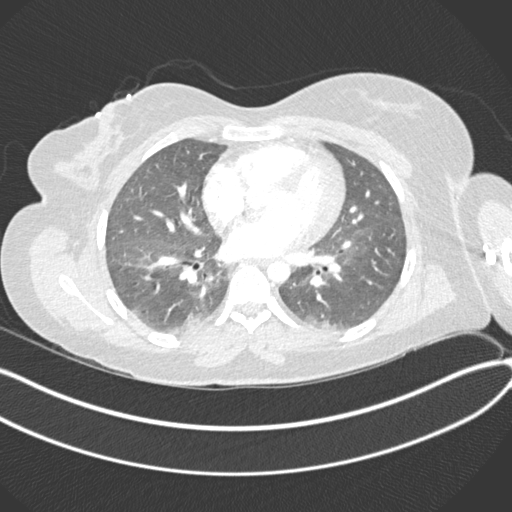
[im 165/279  soft-tissue]
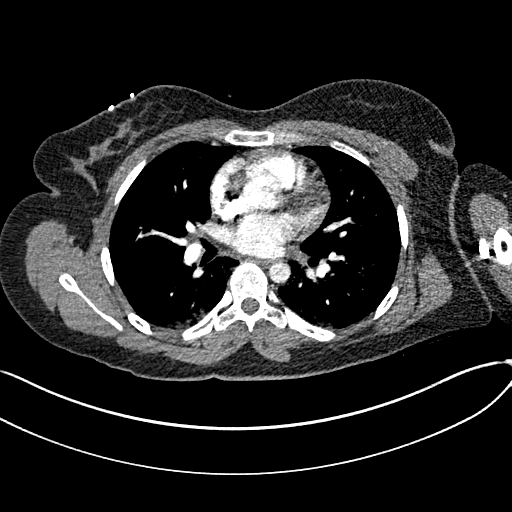
[im 177/279  lung]
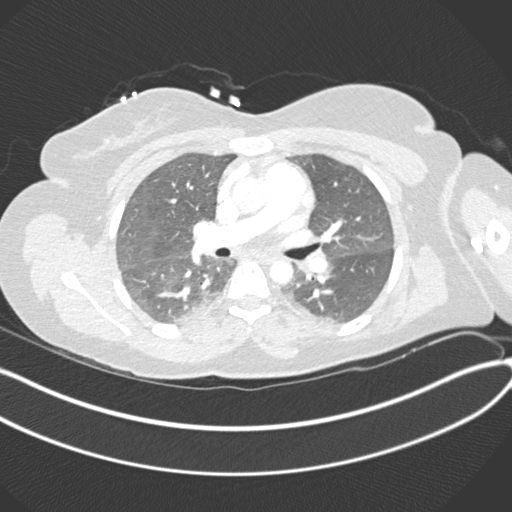
[im 203/279  soft-tissue]
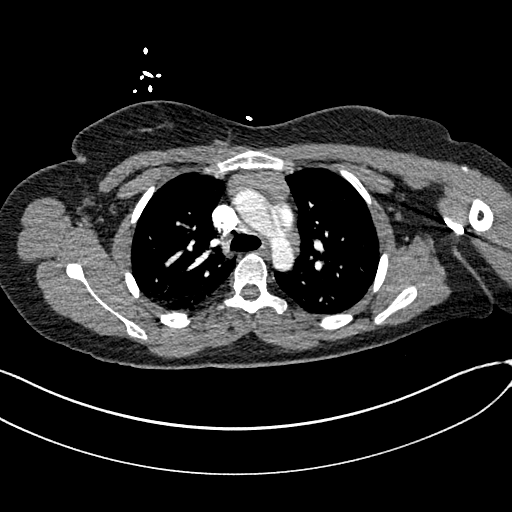
[im 215/279  lung]
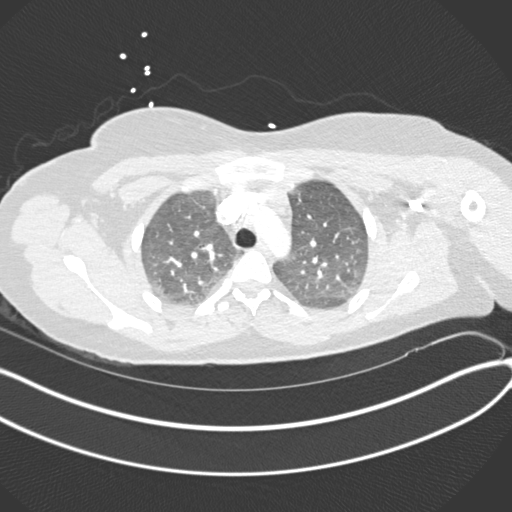
[im 228/279  soft-tissue]
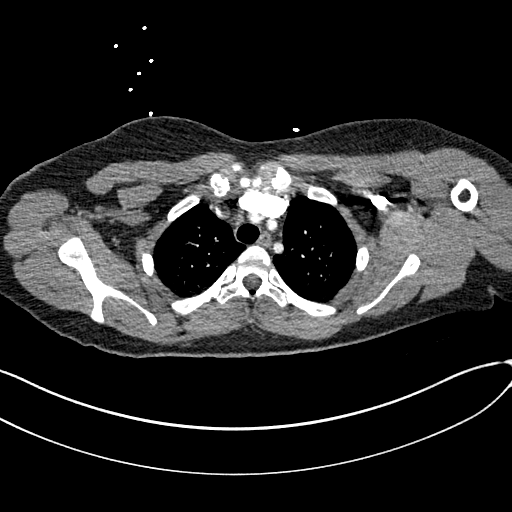
[im 253/279  lung]
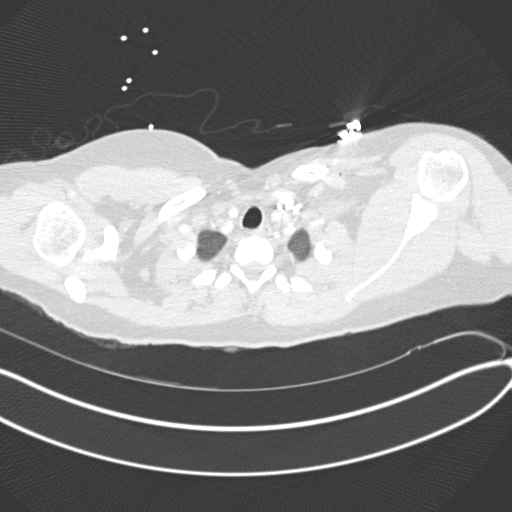
[im 266/279  soft-tissue]
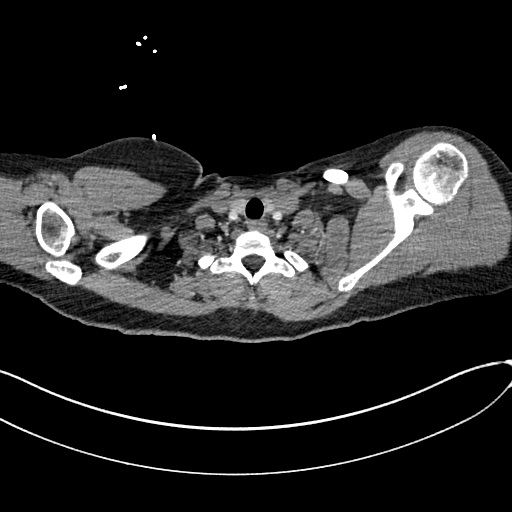

[Series 8: coronal mpr · coronal · 0.59mm/px · 3 of 151 slices shown]
[im 38/151  soft-tissue]
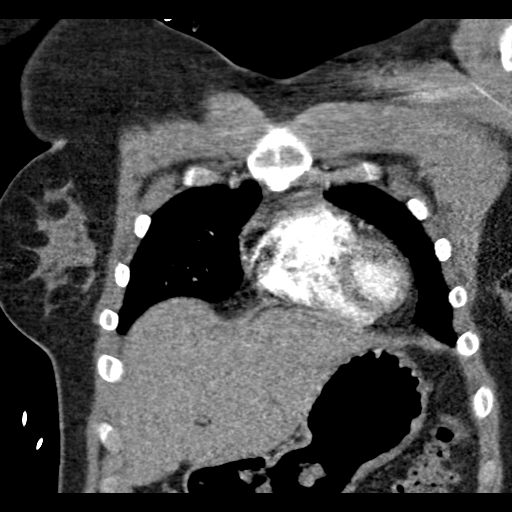
[im 76/151  soft-tissue]
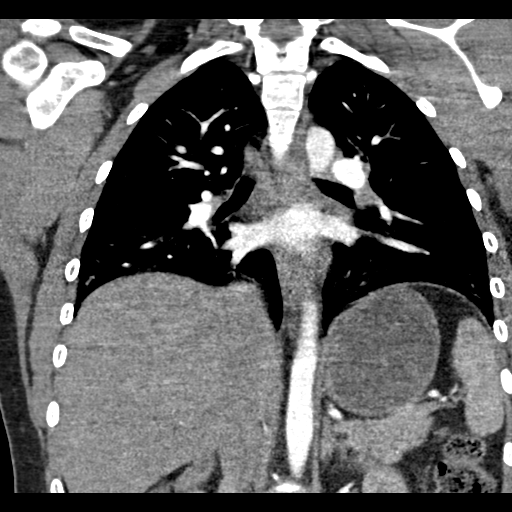
[im 113/151  soft-tissue]
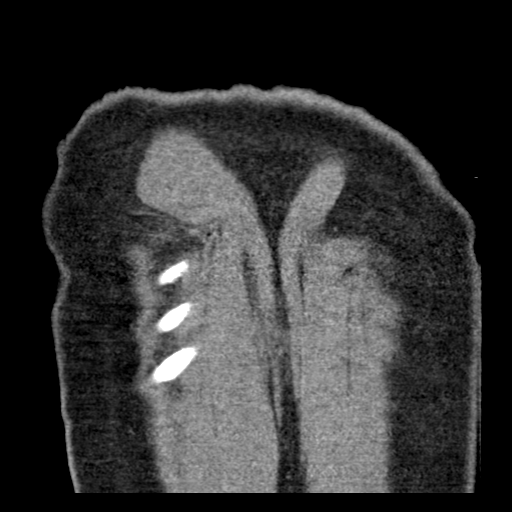

[19 of 46 positions shown; findings below may reference images not displayed]

FINDINGS: Cardiovascular: There is no cardiomegaly or pericardial effusion.
The thoracic aorta is unremarkable. The origins of the great vessels
of the aortic arch are patent. There is no CT evidence of pulmonary
embolism.

Mediastinum/Nodes: Top-normal right hilar lymph node measures 9 mm.
No mediastinal adenopathy. Mildly prominent superior aortic
pericardial recess in the anterior mediastinum.

Lungs/Pleura: Mildly diffuse hazy airspace density with areas of
heterogeneity at the lung bases most consistent with atelectatic
changes. Mild edema or an atypical infection is less likely.
Clinical correlation is recommended. There is no focal
consolidation, pleural effusion, or pneumothorax. The central
airways are patent.

Upper Abdomen: No acute abnormality.

Musculoskeletal: No chest wall abnormality. No acute or significant
osseous findings.

Review of the MIP images confirms the above findings.
IMPRESSION: 1. No CT evidence of pulmonary embolism.
2. Mild diffuse hazy airspace density, likely atelectatic changes.
Edema or atypical infection is less likely. Clinical correlation is
recommended.

## 2019-09-04 DIAGNOSIS — D225 Melanocytic nevi of trunk: Secondary | ICD-10-CM | POA: Diagnosis not present

## 2019-09-04 DIAGNOSIS — L905 Scar conditions and fibrosis of skin: Secondary | ICD-10-CM | POA: Diagnosis not present

## 2019-09-04 DIAGNOSIS — L728 Other follicular cysts of the skin and subcutaneous tissue: Secondary | ICD-10-CM | POA: Diagnosis not present

## 2019-09-04 DIAGNOSIS — L700 Acne vulgaris: Secondary | ICD-10-CM | POA: Diagnosis not present

## 2021-08-15 ENCOUNTER — Encounter (HOSPITAL_COMMUNITY): Payer: Self-pay | Admitting: Emergency Medicine

## 2021-08-15 ENCOUNTER — Emergency Department (HOSPITAL_COMMUNITY): Payer: BC Managed Care – PPO

## 2021-08-15 ENCOUNTER — Emergency Department (HOSPITAL_COMMUNITY)
Admission: EM | Admit: 2021-08-15 | Discharge: 2021-08-16 | Disposition: A | Discharge status: Home / Self Care | Payer: BC Managed Care – PPO | Attending: Emergency Medicine | Admitting: Emergency Medicine

## 2021-08-15 ENCOUNTER — Other Ambulatory Visit: Payer: Self-pay

## 2021-08-15 DIAGNOSIS — Z5321 Procedure and treatment not carried out due to patient leaving prior to being seen by health care provider: Secondary | ICD-10-CM | POA: Diagnosis not present

## 2021-08-15 DIAGNOSIS — R000 Tachycardia, unspecified: Secondary | ICD-10-CM | POA: Diagnosis not present

## 2021-08-15 LAB — CBC WITH DIFFERENTIAL/PLATELET
Abs Immature Granulocytes: 0.01 10*3/uL (ref 0.00–0.07)
Basophils Absolute: 0 10*3/uL (ref 0.0–0.1)
Basophils Relative: 1 %
Eosinophils Absolute: 0 10*3/uL (ref 0.0–0.5)
Eosinophils Relative: 0 %
HCT: 45.9 % (ref 36.0–46.0)
Hemoglobin: 14.7 g/dL (ref 12.0–15.0)
Immature Granulocytes: 0 %
Lymphocytes Relative: 14 %
Lymphs Abs: 0.5 10*3/uL — ABNORMAL LOW (ref 0.7–4.0)
MCH: 30.5 pg (ref 26.0–34.0)
MCHC: 32 g/dL (ref 30.0–36.0)
MCV: 95.2 fL (ref 80.0–100.0)
Monocytes Absolute: 0.5 10*3/uL (ref 0.1–1.0)
Monocytes Relative: 17 %
Neutro Abs: 2.2 10*3/uL (ref 1.7–7.7)
Neutrophils Relative %: 68 %
Platelets: 214 10*3/uL (ref 150–400)
RBC: 4.82 MIL/uL (ref 3.87–5.11)
RDW: 12.5 % (ref 11.5–15.5)
WBC: 3.2 10*3/uL — ABNORMAL LOW (ref 4.0–10.5)
nRBC: 0 % (ref 0.0–0.2)

## 2021-08-15 LAB — URINALYSIS, ROUTINE W REFLEX MICROSCOPIC
Bilirubin Urine: NEGATIVE
Glucose, UA: NEGATIVE mg/dL
Hgb urine dipstick: NEGATIVE
Ketones, ur: >80 mg/dL — AB
Leukocytes,Ua: NEGATIVE
Nitrite: NEGATIVE
Protein, ur: NEGATIVE mg/dL
Specific Gravity, Urine: 1.025 (ref 1.005–1.030)
pH: 6 (ref 5.0–8.0)

## 2021-08-15 LAB — COMPREHENSIVE METABOLIC PANEL
ALT: 14 U/L (ref 0–44)
AST: 17 U/L (ref 15–41)
Albumin: 4.2 g/dL (ref 3.5–5.0)
Alkaline Phosphatase: 31 U/L — ABNORMAL LOW (ref 38–126)
Anion gap: 10 (ref 5–15)
BUN: 10 mg/dL (ref 6–20)
CO2: 18 mmol/L — ABNORMAL LOW (ref 22–32)
Calcium: 9 mg/dL (ref 8.9–10.3)
Chloride: 105 mmol/L (ref 98–111)
Creatinine, Ser: 0.84 mg/dL (ref 0.44–1.00)
GFR, Estimated: >60 mL/min (ref >60)
Glucose, Bld: 90 mg/dL (ref 70–99)
Potassium: 3.7 mmol/L (ref 3.5–5.1)
Sodium: 133 mmol/L — ABNORMAL LOW (ref 135–145)
Total Bilirubin: 0.5 mg/dL (ref 0.3–1.2)
Total Protein: 7.3 g/dL (ref 6.5–8.1)

## 2021-08-15 LAB — HCG, SERUM, QUALITATIVE: Preg, Serum: NEGATIVE

## 2021-08-15 MED ORDER — ACETAMINOPHEN 325 MG PO TABS
650.0000 mg | ORAL_TABLET | Freq: Once | ORAL | Status: AC
  Administered 2021-08-15: 650 mg via ORAL
  Filled 2021-08-15: qty 2

## 2021-08-15 NOTE — ED Triage Notes (Signed)
Pt c/o back pain, fever and tachycardia. Denies urinary symptoms.

## 2021-08-15 NOTE — ED Provider Notes (Signed)
Emergency Medicine Provider Triage Evaluation Note  Susan Knox , a 25 y.o. female  was evaluated in triage.  Pt complains of flank pain, tachycardia, fever symptoms began yesterday.  Does report pain along the right and the left flank that is been ongoing.  Does her temperature to wax and wane around 100.2-100.4, has been taking Tylenol without much improvement in symptoms.  Patient is currently employed as a Social worker, does report no sick contacts, however she is around children all day.  She does not have any urinary symptoms.  Last menstrual period in the last week of August.  No upper respiratory symptoms.  No vaginal discharge or bleeding.  Review of Systems  Positive: Fever, flank pain, back pain Negative: Urinary symptoms, chest pain, vaginal bleeding.   Physical Exam  BP 113/81 (BP Location: Left Arm)   Pulse (!) 136   Temp 100.2 F (37.9 C) (Oral)   Resp 16   SpO2 99%  Gen:   Awake, no distress   Resp:  Normal effort  MSK:   Moves extremities without difficulty  Other:    Medical Decision Making  Medically screening exam initiated at 8:44 PM.  Appropriate orders placed.  Susan Knox was informed that the remainder of the evaluation will be completed by another provider, this initial triage assessment does not replace that evaluation, and the importance of remaining in the ED until their evaluation is complete.     Susan Manges, PA-C 08/15/21 2045    Susan Plan, MD 08/22/21 1740

## 2021-08-17 LAB — URINE CULTURE: Culture: <10000 — AB

## 2022-12-05 NOTE — L&D Delivery Note (Signed)
   Delivery Note:   G1P0 at [redacted]w[redacted]d  Admitting diagnosis: Normal labor [O80, Z37.9] Risks: none Onset of labor: 07/04/23 at 0500 IOL/Augmentation: N/A ROM: 07/04/2023 at 1400, clear fluid  In waterbirth tub at 1838  Complete dilation at 07/04/2023 1905 Onset of pushing at 1905 FHR second stage reassuring via Doppler, 130s  Analgesia/Anesthesia intrapartum:None Pushing in hands and knees in waterbirth tub position with CNM and L&D staff support. Husband, Susan Knox, present for birth and supportive.  Delivery of a Live born female  Birth Weight:  pending APGAR: 8, 9  Newborn Delivery   Birth date/time: 07/04/2023 19:05:00 Delivery type: Vaginal, Spontaneous    in cephalic presentation, position OA to LOA.  APGAR:1 min-8 , 5 min-9   Nuchal Cord: Yes x 1 Cord double clamped after cessation of pulsation, cut by Susan Knox.  Collection of cord blood for typing completed. Arterial cord blood sample-No   Placenta delivered-Spontaneous with 3 vessels. Uterotonics: None Placenta to patient per request Uterine tone firm  Bleeding scant  None laceration identified.  Episiotomy:None Local analgesia: N/A  Repair: N/A Est. Blood Loss (mL):64.00  Complications: None  Mom to postpartum. Baby Susan Knox to Couplet care / Skin to Skin.  Delivery Report:   Review the Delivery Report for details.    June Leap, CNM, MSN 07/04/2023, 7:25 PM

## 2022-12-19 LAB — OB RESULTS CONSOLE ANTIBODY SCREEN: Antibody Screen: NEGATIVE

## 2022-12-19 LAB — OB RESULTS CONSOLE ABO/RH: RH Type: POSITIVE

## 2023-01-19 LAB — OB RESULTS CONSOLE HIV ANTIBODY (ROUTINE TESTING): HIV: NONREACTIVE

## 2023-01-19 LAB — OB RESULTS CONSOLE HEPATITIS B SURFACE ANTIGEN: Hepatitis B Surface Ag: NEGATIVE

## 2023-01-19 LAB — OB RESULTS CONSOLE VARICELLA ZOSTER ANTIBODY, IGG: Varicella: NON-IMMUNE/NOT IMMUNE

## 2023-01-19 LAB — OB RESULTS CONSOLE RUBELLA ANTIBODY, IGM: Rubella: NON-IMMUNE/NOT IMMUNE

## 2023-06-07 LAB — OB RESULTS CONSOLE GBS: GBS: NEGATIVE

## 2023-07-04 ENCOUNTER — Encounter (HOSPITAL_COMMUNITY): Payer: Self-pay

## 2023-07-04 ENCOUNTER — Other Ambulatory Visit: Payer: Self-pay

## 2023-07-04 ENCOUNTER — Inpatient Hospital Stay (HOSPITAL_COMMUNITY)
Admission: AD | Admit: 2023-07-04 | Discharge: 2023-07-05 | Disposition: A | Discharge status: Home / Self Care | Payer: BC Managed Care – PPO | Attending: Obstetrics and Gynecology | Admitting: Obstetrics and Gynecology

## 2023-07-04 DIAGNOSIS — D62 Acute posthemorrhagic anemia: Secondary | ICD-10-CM | POA: Diagnosis not present

## 2023-07-04 DIAGNOSIS — Z3A40 40 weeks gestation of pregnancy: Secondary | ICD-10-CM

## 2023-07-04 DIAGNOSIS — O9081 Anemia of the puerperium: Secondary | ICD-10-CM | POA: Diagnosis not present

## 2023-07-04 DIAGNOSIS — O99892 Other specified diseases and conditions complicating childbirth: Secondary | ICD-10-CM | POA: Diagnosis present

## 2023-07-04 DIAGNOSIS — R000 Tachycardia, unspecified: Secondary | ICD-10-CM | POA: Diagnosis present

## 2023-07-04 DIAGNOSIS — O480 Post-term pregnancy: Principal | ICD-10-CM | POA: Diagnosis present

## 2023-07-04 LAB — BASIC METABOLIC PANEL
Anion gap: 16 — ABNORMAL HIGH (ref 5–15)
BUN: 13 mg/dL (ref 6–20)
CO2: 16 mmol/L — ABNORMAL LOW (ref 22–32)
Calcium: 8.9 mg/dL (ref 8.9–10.3)
Chloride: 103 mmol/L (ref 98–111)
Creatinine, Ser: 0.9 mg/dL (ref 0.44–1.00)
GFR, Estimated: >60 mL/min (ref >60)
Glucose, Bld: 162 mg/dL — ABNORMAL HIGH (ref 70–99)
Potassium: 3.3 mmol/L — ABNORMAL LOW (ref 3.5–5.1)
Sodium: 135 mmol/L (ref 135–145)

## 2023-07-04 LAB — GLUCOSE, CAPILLARY: Glucose-Capillary: 166 mg/dL — ABNORMAL HIGH (ref 70–99)

## 2023-07-04 LAB — CBC
HCT: 30.8 % — ABNORMAL LOW (ref 36.0–46.0)
Hemoglobin: 9.8 g/dL — ABNORMAL LOW (ref 12.0–15.0)
MCH: 27.2 pg (ref 26.0–34.0)
MCHC: 31.8 g/dL (ref 30.0–36.0)
MCV: 85.6 fL (ref 80.0–100.0)
Platelets: 270 10*3/uL (ref 150–400)
RBC: 3.6 MIL/uL — ABNORMAL LOW (ref 3.87–5.11)
RDW: 14.3 % (ref 11.5–15.5)
WBC: 31.7 10*3/uL — ABNORMAL HIGH (ref 4.0–10.5)
nRBC: 0 % (ref 0.0–0.2)

## 2023-07-04 LAB — FIBRINOGEN: Fibrinogen: 369 mg/dL (ref 210–475)

## 2023-07-04 LAB — TYPE AND SCREEN
ABO/RH(D): A POS
Antibody Screen: NEGATIVE

## 2023-07-04 LAB — PROTIME-INR
INR: 1.1 (ref 0.8–1.2)
Prothrombin Time: 14.1 seconds (ref 11.4–15.2)

## 2023-07-04 LAB — HIV ANTIBODY (ROUTINE TESTING W REFLEX): HIV Screen 4th Generation wRfx: NONREACTIVE

## 2023-07-04 MED ORDER — ACETAMINOPHEN 500 MG PO TABS: 1000.0000 mg | ORAL_TABLET | Freq: Four times a day (QID) | ORAL | Status: DC | PRN

## 2023-07-04 MED ORDER — SODIUM CHLORIDE 0.9% FLUSH: 3.0000 mL | Freq: Two times a day (BID) | INTRAVENOUS | Status: DC

## 2023-07-04 MED ORDER — LIDOCAINE HCL (PF) 1 % IJ SOLN: 30.0000 mL | INTRAMUSCULAR | Status: DC | PRN

## 2023-07-04 MED ORDER — AMMONIA AROMATIC IN INHA
RESPIRATORY_TRACT | Status: AC
  Filled 2023-07-04: qty 10

## 2023-07-04 MED ORDER — LACTATED RINGERS IV SOLN
500.0000 mL | INTRAVENOUS | Status: DC | PRN
  Administered 2023-07-04: 250 mL via INTRAVENOUS

## 2023-07-04 MED ORDER — FENTANYL CITRATE (PF) 100 MCG/2ML IJ SOLN: 50.0000 ug | INTRAMUSCULAR | Status: DC | PRN

## 2023-07-04 MED ORDER — OXYTOCIN-SODIUM CHLORIDE 30-0.9 UT/500ML-% IV SOLN: 2.5000 [IU]/h | INTRAVENOUS | Status: DC

## 2023-07-04 MED ORDER — ONDANSETRON HCL 4 MG/2ML IJ SOLN: 4.0000 mg | Freq: Four times a day (QID) | INTRAMUSCULAR | Status: DC | PRN

## 2023-07-04 MED ORDER — OXYTOCIN BOLUS FROM INFUSION: 333.0000 mL | Freq: Once | INTRAVENOUS | Status: DC

## 2023-07-04 MED ORDER — LACTATED RINGERS IV SOLN: INTRAVENOUS | Status: DC

## 2023-07-04 MED ORDER — SODIUM CHLORIDE 0.9% FLUSH: 3.0000 mL | INTRAVENOUS | Status: DC | PRN

## 2023-07-04 MED ORDER — SOD CITRATE-CITRIC ACID 500-334 MG/5ML PO SOLN: 30.0000 mL | ORAL | Status: DC | PRN

## 2023-07-04 MED ORDER — SODIUM CHLORIDE 0.9 % IV SOLN: INTRAVENOUS | Status: DC | PRN

## 2023-07-04 MED ORDER — OXYTOCIN 10 UNIT/ML IJ SOLN: 10.0000 [IU] | Freq: Once | INTRAMUSCULAR | Status: DC

## 2023-07-04 NOTE — H&P (Signed)
   OB ADMISSION/ HISTORY & PHYSICAL:  Admission Date: 07/04/2023  6:10 PM  Admit Diagnosis: Normal labor [O80, Z37.9]    Susan Knox is a 27 y.o. female G1P0 at [redacted]w[redacted]d presenting for active labor. Reports contractions started at 0500 and SROM at 1600. Denies vaginal bleeding. Endorses + fetal movement. Husband, Alverda Skeans, present and supportive. Eagerly anticipating a baby girl with a surprise name!  Prenatal History: G1P0   EDC: 07/02/2023 Prenatal care at [facility] since 16 weeks  Primary: A. Yetta Barre, CNM  Prenatal course complicated by: Rubella and varicella non-immune  Prenatal Labs: ABO, Rh: A (01/15 0000)  Antibody: Negative (01/15 0000) Rubella: Nonimmune (02/15 0000)  RPR:   Non-reactive HBsAg: Negative (02/15 0000)  HIV: Non-reactive (02/15 0000)  GBS: Negative/-- (07/03 0000)  1 hr Glucola : GDM self screen WNL Genetic Screening: Declines Ultrasound: normal XX anatomy, posterior placenta    Maternal Diabetes: No Genetic Screening: Declined Maternal Ultrasounds/Referrals: Normal Fetal Ultrasounds or other Referrals:  None Maternal Substance Abuse:  No Significant Maternal Medications:  None Significant Maternal Lab Results:  Group B Strep negative Other Comments:  None  Medical / Surgical History : Past medical history: History reviewed. No pertinent past medical history.  Past surgical history: History reviewed. No pertinent surgical history.  Family History:  Family History  Problem Relation Age of Onset   Healthy Mother    Atrial fibrillation Father    Healthy Brother     Social History:  reports that she has never smoked. She has never used smokeless tobacco. She reports that she does not drink alcohol and does not use drugs.  Allergies: Patient has no known allergies.   Current Medications at time of admission:  No medications prior to admission.    Review of Systems: Review of Systems  All other systems reviewed and are negative.  Physical  Exam: Vital signs and nursing notes reviewed.  Patient Vitals for the past 24 hrs:  Height Weight  07/04/23 1828 5\' 2"  (1.575 m) 65.8 kg    General: AAO x 3, NAD Heart: RRR Lungs:CTAB Abdomen: Gravid, NT Extremities: no edema SVE:   declined  FHR: 125BPM, moderate variability, + accels, no decels TOCO: Contractions q 2-3 minutes  Assessment/Plan: 27 y.o. G1P0 at [redacted]w[redacted]d, active labor  Fetal wellbeing - FHT category 1 EFW AGA 6-7lbs  Labor: Expectant management, prepare waterbirth tub  GBS negative Rubella non-immune Rh positive  Pain control: desires unmedicated waterbirth, class complete Analgesia/anesthesia PRN  Anticipated MOD: NSVB  Plans to breastfeed. POC discussed with patient and support team, all questions answered.  Dr. Conni Elliot notified of admission/plan of care.  June Leap CNM, MSN 07/04/2023, 7:23 PM

## 2023-07-04 NOTE — Progress Notes (Signed)
Called by patient's midwife to evaluate patient. Midwife has been called by LD RN 3 times regarding patient status post delivery. Patient had an uncomplicated waterbirth at [employer]. EBL at delivery was 64. Patient then refused all interventions including Pitocin and fundal rubs. Patient became symptomatic with LOC x2 requiring faculty MD evaluation. Patient had QBL of 800 cc with passage of clots. Patient had 1 fundal rub with uterus firm below U. Patient agreed to IV access and IVF hydration. Had stat Hgb drawn resulting at 9.8. When I came to evaluate patient she appeared pale, resting in bed but alert and oriented. BP improved to 120s/70s but tachycardic to the 140's. Family at bedside. I counseled patient on PPH and importance of properly evaluating patient's bleeding. Patient agreed to fundal rub for me to evaluate bleeding. Uterine fundus noted to be displaced to the right but firm at 1 below U and bleeding light. I recommended Pitocin prophylaxis to maintain uterine tone, given symptomatic acute blood loss anemia with PPH, however patient refuses. I advised patient that refusing Pitocin may increase her risk for ongoing bleeding and subsequent need for blood transfusion. Patient verbalizes understanding of risks and declines. She plans to start breast feeding to help with bleeding. I reviewed importance of allowing nurses to continue to perform fundal rub assessments of bleeding. Will continue current IVF hydration. Repeat CBC in AM or sooner as clinically indicated.   Susan Knox A Shiva Sahagian 07/04/23 11:07 PM

## 2023-07-04 NOTE — Progress Notes (Signed)
I went in to check on patient, she appears to be pale and she was smelling peppermint oil provided by the doula. I asked if she was okay, her reply was" yes, just smelling some oil for nausea". Patient has declined all fundal rubs, visual checks and vital signs.   [name], RN

## 2023-07-04 NOTE — Plan of Care (Signed)
After delivery I advised patient the procedure of fundal rubs every 15 minutes for the first hour. The patient refused all vital signs and fundal rubs and checks.  [name], RN

## 2023-07-04 NOTE — Significant Event (Signed)
Called to see and evaluate patient following two episodes of loss of consciousness and hypotension. She is a 1/1 s/p SVD (water birth) today that was uncomplicated. She did not have postpartum pitocin. She did not have IV access.   Prior to my arrival, the patient had an episode of significant vaginal bleeding since her delivery. She had been declining fundal massage but did allow for a fundal massage by the RN prior to my arrival.   Upon my arrival, she was conscious and in bed. She denied CP/SOB. She had felt lightheaded but felt improved now. She reported minimal PO intake today. IV access was obtained and fluid bolus started.   Today's Vitals   07/04/23 2133 07/04/23 2148 07/04/23 2207 07/04/23 2220  BP: (!) 84/50 96/74 104/71 122/75  Pulse: (!) 141 (!) 131 (!) 122 (!) 126  Resp: 16     SpO2:    98%  Weight:      Height:       I performed a bedside US which showed minimal blood in the fundus. Bladder was empty. Lower uterine segment appeared to have clots still within it. No doppler flow - no concern for RPOC.   QBL 862 EBL from delivery 15  Discussed with pt (with family present) the important of fundal massage for preventing further hemorrhage in hopes of avoiding blood transfusion. She stated she would consider it. She had been recommended pitocin by Dorisann Frames, CNM and the patient declined, stating "absolutely not". I recommended skin to skin with the baby and attempting nursing as soon as she feels able. CBC, fibrinogen, PT/INR, BMP and T&S drawn and pending. Bedside CBG 166. Last hgb according to prenatal records on 5/14 was 10.8 an dplatelets were normla at that time. No admission Hgb as patient declined.   Nursing team informing Dorisann Frames, CNM throughout who then communicated with Dr. Conni Elliot who is en route to evaluate the patient as well.   Milas Hock, MD Attending Obstetrician & Gynecologist, [facility], [facility]

## 2023-07-04 NOTE — Progress Notes (Signed)
Transferring patient to wheelchair the patient started to feel faint, she passed out and Ammonia was used. Dorisann Frames, CNM was contacted, in which she recommended that we get a BP & do do a fundal rub. She declined an IV for fluids at this time and she wanted to just drink because she felt like she was just dehydrated. Patient first declined to have vitals done however she finally allowed [place] to do a Blood Pressure in which read 84/50. She  did at that time decline the fundal rub.   Patient was sitting in the wheelchair and she begin to feel faint again. At that time, she was moved by her support person and 2 nurses. At that time, the patient agreed to have an IV started and some IV fluids started. A 1000 Liter Bolus was given to patient. At this point her BP was 96/74. Patient has since had two normal blood pressures.   At this time, Patient refuses pitocin and would like breast feed to help with bleeding. She did allow Dr. Conni Elliot to do a fundal rub where she was firm. Patient informed of the Importance of fundal rubs and bleeding.    [name], RN

## 2023-07-04 NOTE — Progress Notes (Addendum)
I asked patient if she would allow me to do a visual check of bleeding. The bottom pad did have a few clots, patient refused fundal rub. She states that she is uncomfortable and is just trying to get comfortable. Support person attempted to massage belly however the patient refused.   Dorisann Frames, CNM was informed of clots on the pad.   [name], RN

## 2023-07-04 NOTE — Progress Notes (Signed)
Advised patient that if she noticed any clots, or gushes of fluid to inform nurse. Patient verbalized understanding.   [name], RN

## 2023-07-04 NOTE — MAU Note (Signed)
.  Susan Knox is a 27 y.o. at [redacted]w[redacted]d here in MAU reporting: ctx that started at 0500 this morning. Now every 2 minutes, water broke around 1600 this afternoon, clear fluid. Denies VB. +FM.    FHT:120

## 2023-07-05 MED ORDER — POLYSACCHARIDE IRON COMPLEX 150 MG PO CAPS: 150.0000 mg | ORAL_CAPSULE | ORAL | Status: AC

## 2023-07-05 MED ORDER — PRENATAL MULTIVITAMIN CH
1.0000 | ORAL_TABLET | Freq: Every day | ORAL | Status: DC
  Filled 2023-07-05: qty 1

## 2023-07-05 MED ORDER — ACETAMINOPHEN 325 MG PO TABS: 650.0000 mg | ORAL_TABLET | ORAL | Status: DC | PRN

## 2023-07-05 MED ORDER — COCONUT OIL OIL: 1.0000 | TOPICAL_OIL | Status: AC | PRN

## 2023-07-05 MED ORDER — COCONUT OIL OIL: 1.0000 | TOPICAL_OIL | Status: DC | PRN

## 2023-07-05 MED ORDER — WITCH HAZEL-GLYCERIN EX PADS: 1.0000 | MEDICATED_PAD | CUTANEOUS | Status: DC | PRN

## 2023-07-05 MED ORDER — MAGNESIUM OXIDE -MG SUPPLEMENT 400 (240 MG) MG PO TABS: 400.0000 mg | ORAL_TABLET | Freq: Every day | ORAL | Status: AC

## 2023-07-05 MED ORDER — ZOLPIDEM TARTRATE 5 MG PO TABS: 5.0000 mg | ORAL_TABLET | Freq: Every evening | ORAL | Status: DC | PRN

## 2023-07-05 MED ORDER — POLYSACCHARIDE IRON COMPLEX 150 MG PO CAPS
150.0000 mg | ORAL_CAPSULE | ORAL | Status: DC
  Filled 2023-07-05: qty 1

## 2023-07-05 MED ORDER — IBUPROFEN 600 MG PO TABS
600.0000 mg | ORAL_TABLET | Freq: Four times a day (QID) | ORAL | Status: DC
  Filled 2023-07-05 (×2): qty 1

## 2023-07-05 MED ORDER — DIPHENHYDRAMINE HCL 25 MG PO CAPS: 25.0000 mg | ORAL_CAPSULE | Freq: Four times a day (QID) | ORAL | Status: DC | PRN

## 2023-07-05 MED ORDER — MAGNESIUM OXIDE -MG SUPPLEMENT 400 (240 MG) MG PO TABS
400.0000 mg | ORAL_TABLET | Freq: Every day | ORAL | Status: DC
  Filled 2023-07-05: qty 1

## 2023-07-05 MED ORDER — ONDANSETRON HCL 4 MG/2ML IJ SOLN: 4.0000 mg | INTRAMUSCULAR | Status: DC | PRN

## 2023-07-05 MED ORDER — TETANUS-DIPHTH-ACELL PERTUSSIS 5-2.5-18.5 LF-MCG/0.5 IM SUSY: 0.5000 mL | PREFILLED_SYRINGE | Freq: Once | INTRAMUSCULAR | Status: DC

## 2023-07-05 MED ORDER — BENZOCAINE-MENTHOL 20-0.5 % EX AERO: 1.0000 | INHALATION_SPRAY | CUTANEOUS | Status: AC | PRN

## 2023-07-05 MED ORDER — ACETAMINOPHEN 325 MG PO TABS: 650.0000 mg | ORAL_TABLET | ORAL | Status: AC | PRN

## 2023-07-05 MED ORDER — ONDANSETRON HCL 4 MG PO TABS: 4.0000 mg | ORAL_TABLET | ORAL | Status: DC | PRN

## 2023-07-05 MED ORDER — DIBUCAINE (PERIANAL) 1 % EX OINT: 1.0000 | TOPICAL_OINTMENT | CUTANEOUS | Status: DC | PRN

## 2023-07-05 MED ORDER — BENZOCAINE-MENTHOL 20-0.5 % EX AERO
1.0000 | INHALATION_SPRAY | CUTANEOUS | Status: DC | PRN
  Administered 2023-07-05: 1 via TOPICAL
  Filled 2023-07-05: qty 56

## 2023-07-05 MED ORDER — SENNOSIDES-DOCUSATE SODIUM 8.6-50 MG PO TABS
2.0000 | ORAL_TABLET | Freq: Every day | ORAL | Status: DC
  Filled 2023-07-05: qty 2

## 2023-07-05 MED ORDER — IBUPROFEN 600 MG PO TABS: 600.0000 mg | ORAL_TABLET | Freq: Four times a day (QID) | ORAL | 0 refills | Status: AC

## 2023-07-05 MED ORDER — SIMETHICONE 80 MG PO CHEW: 80.0000 mg | CHEWABLE_TABLET | ORAL | Status: DC | PRN

## 2023-07-05 NOTE — Discharge Summary (Signed)
OB Discharge Summary  Patient Name: Susan Knox DOB: 03/29/1996 MRN: [id number]  Date of admission: 07/04/2023 Delivering provider: Dorisann Frames K  Admitting diagnosis: Normal labor [O80, Z37.9] Intrauterine pregnancy: [redacted]w[redacted]d     Secondary diagnosis: Patient Active Problem List   Diagnosis Date Noted   Normal labor 07/04/2023   SVD/Waterbirth 07/04/2023   Postpartum care following vaginal delivery 7/30 07/04/2023   Additional problems:acute blood loss anemia, patient declines all interventions   Date of discharge: 07/05/2023   Discharge diagnosis: Principal Problem:   Postpartum care following vaginal delivery 7/30 Active Problems:   Normal labor   SVD/Waterbirth                                                              Post partum procedures: none  Augmentation: N/A Pain control: None Laceration:None Episiotomy:None Complications: None  Hospital course:  Onset of Labor With Vaginal Delivery      27 y.o. yo G1P1001 at [redacted]w[redacted]d was admitted in Active Labor on 07/04/2023. Labor course was complicated by none  Membrane Rupture Time/Date: 2:00 PM,07/04/2023  Delivery Method:Vaginal, Spontaneous Operative Delivery:N/A Episiotomy: None Lacerations:  None Patient had a postpartum course complicated by postpartum hemorrhage, compounded by patient declining fundal checks and all uterotonics. Passed several large clots during immediate postpartum.  A bedside US was done by hospitalist attending which showed minimal blood in the fundus. Bladder was empty. Lower uterine segment appeared to have clots still within it. No doppler flow - no concern for RPOC. QBL 862, EBL from delivery 64. Patient had syncopal episodes x 2 with transfer to postpartum floor, eventually consented to IV fluid bolus to correct hypotension.  She is ambulating, tolerating a regular diet, passing flatus, and urinating well. Patient remained tachycardic but continued to decline any parenteral or oral  medications / fluids, declined any iron supplementation. Patient is discharged home in stable condition on 07/05/23.  Newborn Data: Birth date:07/04/2023 Birth time:7:05 PM Gender:Female Living status:Living Apgars:8 ,9  Weight:3100 g  Physical exam  Vitals:   07/04/23 2316 07/04/23 2350 07/05/23 0215 07/05/23 1345  BP: 116/72 (!) 127/92 108/77 117/81  Pulse: (!) 150 (!) 138 (!) 127 (!) 125  Resp:  16 16 16   Temp:  98 F (36.7 C) 97.6 F (36.4 C) 98 F (36.7 C)  TempSrc:  Oral Oral Oral  SpO2:    100%  Weight:      Height:       General: alert and cooperative, pale Lochia: appropriate Uterine Fundus: firm Incision: N/A Perineum: intact, no edema DVT Evaluation: No cords or calf tenderness. No significant calf/ankle edema. Labs: Lab Results  Component Value Date   WBC 31.7 (H) 07/04/2023   HGB 9.8 (L) 07/04/2023   HCT 30.8 (L) 07/04/2023   MCV 85.6 07/04/2023   PLT 270 07/04/2023      Latest Ref Rng & Units 07/04/2023   10:02 PM  CMP  Glucose 70 - 99 mg/dL 932   BUN 6 - 20 mg/dL 13   Creatinine 3.55 - 1.00 mg/dL 7.32   Sodium 202 - 542 mmol/L 135   Potassium 3.5 - 5.1 mmol/L 3.3   Chloride 98 - 111 mmol/L 103   CO2 22 - 32 mmol/L 16   Calcium 8.9 - 10.3 mg/dL  8.9       07/05/2023    9:21 AM  Edinburgh Postnatal Depression Scale Screening Tool  I have been able to laugh and see the funny side of things. 0  I have looked forward with enjoyment to things. 0  I have blamed myself unnecessarily when things went wrong. 0  I have been anxious or worried for no good reason. 0  I have felt scared or panicky for no good reason. 0  Things have been getting on top of me. 0  I have been so unhappy that I have had difficulty sleeping. 0  I have felt sad or miserable. 0  I have been so unhappy that I have been crying. 0  The thought of harming myself has occurred to me. 0  Edinburgh Postnatal Depression Scale Total 0   Discharge instruction:  per After Visit  Summary,  [facility] booklet and  "Understanding Mother & Baby Care" hospital booklet After Visit Meds:  Allergies as of 07/05/2023   No Known Allergies      Medication List     TAKE these medications    acetaminophen 325 MG tablet Commonly known as: Tylenol Take 2 tablets (650 mg total) by mouth every 4 (four) hours as needed (for pain scale < 4).   benzocaine-Menthol 20-0.5 % Aero Commonly known as: DERMOPLAST Apply 1 Application topically as needed for irritation (perineal discomfort).   coconut oil Oil Apply 1 Application topically as needed.   ibuprofen 600 MG tablet Commonly known as: ADVIL Take 1 tablet (600 mg total) by mouth every 6 (six) hours.   iron polysaccharides 150 MG capsule Commonly known as: Ferrex 150 Take 1 capsule (150 mg total) by mouth every other day.   magnesium oxide 400 (240 Mg) MG tablet Commonly known as: MAG-OX Take 1 tablet (400 mg total) by mouth daily. For prevention of constipation.               Discharge Care Instructions  (From admission, onward)           Start     Ordered   07/05/23 0000  Discharge wound care:       Comments: Sitz baths 2 times /day with warm water x 1 week. May add herbals: 1 ounce dried comfrey leaf* 1 ounce calendula flowers 1 ounce lavender flowers  Supplies can be found online at [employer] sources at [employer], [employer]  1/2 ounce dried uva ursi leaves 1/2 ounce witch hazel blossoms (if you can find them) 1/2 ounce dried sage leaf 1/2 cup sea salt Directions: Bring 2 quarts of water to a boil. Turn off heat, and place 1 ounce (approximately 1 large handful) of the above mixed herbs (not the salt) into the pot. Steep, covered, for 30 minutes.  Strain the liquid well with a fine mesh strainer, and discard the herb material. Add 2 quarts of liquid to the tub, along with the 1/2 cup of salt. This medicinal liquid can also be made into compresses and peri-rinses.   07/05/23  1806           Diet: iron rich diet Activity: Advance as tolerated. Pelvic rest for 6 weeks.  Postpartum contraception: TBA in office Newborn Data: Live born female  Birth Weight: 6 lb 13.4 oz (3100 g) APGAR: 8, 9  Newborn Delivery   Birth date/time: 07/04/2023 19:05:00 Delivery type: Vaginal, Spontaneous     named Ovie Baby Feeding: Breast Disposition:home with mother Delivery Report:  Review the Delivery Report for details.   Follow up:  Follow-up Information     June Leap, CNM. Schedule an appointment as soon as possible for a visit in 2 week(s).   Specialty: Certified Nurse Midwife Why: For Postpartum follow-up / check CBC Contact information: [address] [place] [address] [phone]                Signed: Neta Mends, CNM, MSN 07/05/2023, 6:06 PM

## 2023-07-05 NOTE — Progress Notes (Signed)
I/v removed per patient request. Importance of I/V access and possible need for re-stick explained. Patient expresses understanding.

## 2023-07-05 NOTE — Progress Notes (Signed)
Patient continues to decline all fundal checks. Occasionally allowing BP/vital sign checks. Declines AM bloodwork.

## 2023-07-05 NOTE — Progress Notes (Signed)
       PPD # 1 S/P NSVD PPH episode last night after delivery, patient declined all interventions including fundal rubs. Live born female  Birth Weight: 6 lb 13.4 oz (3100 g) APGAR: 8, 9  Newborn Delivery   Birth date/time: 07/04/2023 19:05:00 Delivery type: Vaginal, Spontaneous    Baby name: Birdena Jubilee Delivering provider: June Leap Episiotomy:None  Lacerations:None  Feeding: breast  Pain control at delivery: None  S:  Reports feeling well, denies dizziness/SOB/CP.              Tolerating po/ No nausea or vomiting             Bleeding is decreased since last night, no further clots. Mild cramping with breastfeeding.             Pain controlled with none, declines all medications.              Up ad lib / ambulatory / voiding without difficulties   O:  A & O x 3, in no apparent distress              VS:  Vitals:   07/04/23 2315 07/04/23 2316 07/04/23 2350 07/05/23 0215  BP:  116/72 (!) 127/92 108/77  Pulse:  (!) 150 (!) 138 (!) 127  Resp:   16 16  Temp:   98 F (36.7 C) 97.6 F (36.4 C)  TempSrc:   Oral Oral  SpO2: 97%     Weight:      Height:        LABS:  Recent Labs    07/04/23 2202  WBC 31.7*  HGB 9.8*  HCT 30.8*  PLT 270    Blood type: --/--/A POS (07/30 2155)  Rubella: Nonimmune (02/15 0000)   I&O: I/O last 3 completed shifts: In: -  Out: 926 [Blood:926]          No intake/output data recorded.   Gen: AAO x 3, NAD  Abdomen: soft, non-tender, non-distended             Fundus: firm, non-tender, U-1  Perineum: intact  Lochia: small  Extremities: no edema, no calf pain or tenderness    A/P: PPD # 1 27 y.o., G1P1001   Principal Problem:   Postpartum care following vaginal delivery 7/30 Active Problems:   Normal labor   SVD/Waterbirth ABL anemia  - symptomatic, sustained tachy - declines IV iron, agrees to oral supplement after counseling  Doing well - stable status  Routine post partum orders  Anticipate discharge  tomorrow    Neta Mends, MSN, CNM 07/05/2023, 10:07 AM

## 2023-07-05 NOTE — Progress Notes (Signed)
Pt declined fundal or perineal assessment.  Pt states she is able to ambulate and void without issues, denies passing blood clots.

## 2023-07-05 NOTE — Lactation Note (Signed)
This note was copied from a baby's chart. Lactation Consultation Note  Patient Name: Girl Quantasia Wambles [id number] Date: 07/05/2023 Age:27 hours Reason for consult: Initial assessment;Term;1st time breastfeeding Per Birth Parent, infant latched twice since Birth, recently BF for 30 minutes on MBU with latch assistance from RN see latch score below. LC did not assist with latch. Birth Parent feels BF is going  well and she will continue to BF infant by cues, on demand, 8 to 12+ times within 24 hours, skin to skin. Birth Parent knows to call RN/LC for further latch assistance if needed. LC discussed infant's input and output. LC discussed the importance of maternal rest, diet and hydration. Birth Parent would like hand pump prior to discharge she does have DEBP at home. Birth Parent was made aware of O/P services, breastfeeding support groups, community resources, and our phone # for post-discharge questions.    Maternal Data Has patient been taught Hand Expression?: No Does the patient have breastfeeding experience prior to this delivery?: No  Feeding Mother's Current Feeding Choice: Breast Milk  LATCH Score ( assessment done by RN) Latch: Repeated attempts needed to sustain latch, nipple held in mouth throughout feeding, stimulation needed to elicit sucking reflex.  Audible Swallowing: A few with stimulation  Type of Nipple: Everted at rest and after stimulation  Comfort (Breast/Nipple): Soft / non-tender  Hold (Positioning): Assistance needed to correctly position infant at breast and maintain latch.  LATCH Score: 7   Lactation Tools Discussed/Used    Interventions Interventions: Skin to skin;Position options;Breast feeding basics reviewed;LC Services brochure;Education  Discharge Pump: DEBP;Personal (Per Birth Parent, she has [employer] DEBP at home.)  Consult Status Consult Status: Follow-up Date: 07/05/23 Follow-up type: In-patient    Frederico Hamman 07/05/2023, 1:48  AM

## 2023-07-05 NOTE — Lactation Note (Signed)
This note was copied from a baby's chart. Lactation Consultation Note  Patient Name: Susan Knox [id number] Date: 07/05/2023 Age:27 hours Reason for consult: Follow-up assessment;Term As LC entered the room , baby asleep on the moms chest.  Per mom last fed at 1:00 pm and has been BF well.  Mom expressed she desires to go mom early at 24 hours due to having  a private LC at home.  Due mom desiring to go home early at 24 hours , LC reviewed BF D/C teaching and the [facility] resources.   Maternal Data    Feeding Mother's Current Feeding Choice: Breast Milk  LATCH Score   Lactation Tools Discussed/Used Tools: Pump;Flanges Flange Size: 21;24 Breast pump type: Manual Pump Education: Milk Storage;Setup, frequency, and cleaning  Interventions  Education.   Discharge Discharge Education: Engorgement and breast care;Warning signs for feeding baby Pump: DEBP;Personal;Manual WIC Program: No  Consult Status Consult Status: Complete Date: 07/05/23    Kathrin Greathouse 07/05/2023, 2:51 PM

## 2023-07-05 NOTE — Discharge Instructions (Signed)
Lactation outpatient support - home visit   Jessica Bowers, IBCLC (lactation consultant)  & Birth Doula  Phone (text or call): [phone] Email: [email or web address]   Linda Coppola RN, MHA, IBCLC at [facility]: Lactation Consultant  [email or web address] Mail: [email or web address] Tel: [phone]   Additional breastfeeding resources:  [facility] https://ibconline.ca/information-[email or web address]  [employer] [place]  www.[email or web address]   Other Resources:  Chiropractic specialist   Dr. Leanna Hastings [email or web address]   Craniosacral therapy for baby  Erin Balkind  [email or web address]  Pediatric Dentist (for tongue ties)  Dr. Kate Lambert Spangler, [facility]  Phone: [phone]  [address] [place]  [address] [email or web address]  

## 2023-08-01 ENCOUNTER — Telehealth (HOSPITAL_COMMUNITY): Payer: Self-pay | Admitting: *Deleted

## 2023-08-01 NOTE — Telephone Encounter (Signed)
08/01/2023  Name: Susan Knox MRN: [id number] DOB: 1995/12/18  Reason for Call:  Transition of Care Hospital Discharge Call  Contact Status: Patient Contact Status: Message  Language assistant needed: Interpreter Mode: Interpreter Not Needed        Follow-Up Questions:    Inocente Salles Postnatal Depression Scale:  In the Past 7 Days:    PHQ2-9 Depression Scale:     Discharge Follow-up:    Post-discharge interventions: NA  Salena Saner, RN 08/01/2023 09:53
